# Patient Record
Sex: Male | Born: 1950 | Race: White | Hispanic: No | Marital: Married | Smoking: Never smoker
Health system: Southern US, Community
[De-identification: ages and names within clinical notes are randomized; demographics above are authoritative.]

## PROBLEM LIST (undated history)

## (undated) DIAGNOSIS — Z87442 Personal history of urinary calculi: Secondary | ICD-10-CM

## (undated) DIAGNOSIS — E119 Type 2 diabetes mellitus without complications: Secondary | ICD-10-CM

## (undated) DIAGNOSIS — I10 Essential (primary) hypertension: Secondary | ICD-10-CM

## (undated) DIAGNOSIS — Z9889 Other specified postprocedural states: Secondary | ICD-10-CM

## (undated) HISTORY — DX: Type 2 diabetes mellitus without complications: E11.9

## (undated) HISTORY — PX: HIP FRACTURE SURGERY: SHX118

## (undated) HISTORY — PX: COLONOSCOPY: SHX174

## (undated) HISTORY — PX: HERNIA REPAIR: SHX51

## (undated) HISTORY — PX: PROSTATE SURGERY: SHX751

## (undated) HISTORY — PX: CATARACT EXTRACTION: SUR2

## (undated) HISTORY — DX: Essential (primary) hypertension: I10

## (undated) HISTORY — PX: CYST EXCISION: SHX5701

---

## 1999-06-23 ENCOUNTER — Encounter: Payer: Self-pay | Admitting: Urology

## 1999-06-23 ENCOUNTER — Encounter: Admission: RE | Admit: 1999-06-23 | Discharge: 1999-06-23 | Payer: Self-pay | Admitting: Urology

## 1999-06-23 ENCOUNTER — Ambulatory Visit (HOSPITAL_BASED_OUTPATIENT_CLINIC_OR_DEPARTMENT_OTHER): Admission: RE | Admit: 1999-06-23 | Discharge: 1999-06-23 | Payer: Self-pay | Admitting: Urology

## 2000-01-18 ENCOUNTER — Encounter: Payer: Self-pay | Admitting: Urology

## 2000-01-18 ENCOUNTER — Encounter: Admission: RE | Admit: 2000-01-18 | Discharge: 2000-01-18 | Payer: Self-pay | Admitting: Urology

## 2000-02-04 ENCOUNTER — Encounter: Payer: Self-pay | Admitting: Urology

## 2000-02-04 ENCOUNTER — Ambulatory Visit (HOSPITAL_COMMUNITY): Admission: RE | Admit: 2000-02-04 | Discharge: 2000-02-04 | Payer: Self-pay | Admitting: Urology

## 2000-03-28 ENCOUNTER — Ambulatory Visit (HOSPITAL_COMMUNITY): Admission: RE | Admit: 2000-03-28 | Discharge: 2000-03-28 | Payer: Self-pay | Admitting: Urology

## 2000-03-28 ENCOUNTER — Encounter: Payer: Self-pay | Admitting: Urology

## 2002-08-28 ENCOUNTER — Ambulatory Visit (HOSPITAL_COMMUNITY): Admission: RE | Admit: 2002-08-28 | Discharge: 2002-08-28 | Payer: Self-pay | Admitting: Pediatrics

## 2002-08-28 ENCOUNTER — Encounter: Payer: Self-pay | Admitting: Pediatrics

## 2002-09-11 ENCOUNTER — Ambulatory Visit (HOSPITAL_COMMUNITY): Admission: RE | Admit: 2002-09-11 | Discharge: 2002-09-11 | Payer: Self-pay | Admitting: *Deleted

## 2003-06-14 ENCOUNTER — Emergency Department (HOSPITAL_COMMUNITY): Admission: EM | Admit: 2003-06-14 | Discharge: 2003-06-14 | Payer: Self-pay | Admitting: Emergency Medicine

## 2003-10-24 ENCOUNTER — Ambulatory Visit (HOSPITAL_COMMUNITY): Admission: RE | Admit: 2003-10-24 | Discharge: 2003-10-24 | Payer: Self-pay | Admitting: Pediatrics

## 2003-12-13 ENCOUNTER — Ambulatory Visit (HOSPITAL_COMMUNITY): Admission: RE | Admit: 2003-12-13 | Discharge: 2003-12-13 | Payer: Self-pay | Admitting: Internal Medicine

## 2005-07-24 ENCOUNTER — Emergency Department (HOSPITAL_COMMUNITY): Admission: EM | Admit: 2005-07-24 | Discharge: 2005-07-24 | Payer: Self-pay | Admitting: Emergency Medicine

## 2007-08-02 ENCOUNTER — Emergency Department (HOSPITAL_COMMUNITY): Admission: EM | Admit: 2007-08-02 | Discharge: 2007-08-03 | Payer: Self-pay | Admitting: Emergency Medicine

## 2007-08-04 ENCOUNTER — Ambulatory Visit (HOSPITAL_COMMUNITY): Admission: RE | Admit: 2007-08-04 | Discharge: 2007-08-04 | Payer: Self-pay | Admitting: Urology

## 2008-02-26 ENCOUNTER — Ambulatory Visit (HOSPITAL_COMMUNITY): Admission: RE | Admit: 2008-02-26 | Discharge: 2008-02-26 | Payer: Self-pay | Admitting: Urology

## 2009-09-28 IMAGING — CR DG ABDOMEN 1V
1 series · 1 of 1 positions shown · non-contrast
Comparison: CT scan, 08/03/07.

CLINICAL DATA: 56 year old; left ureteral calculus. Preoperative examination.
ABDOMEN ? 1 VIEW:

[t abdomen supine]
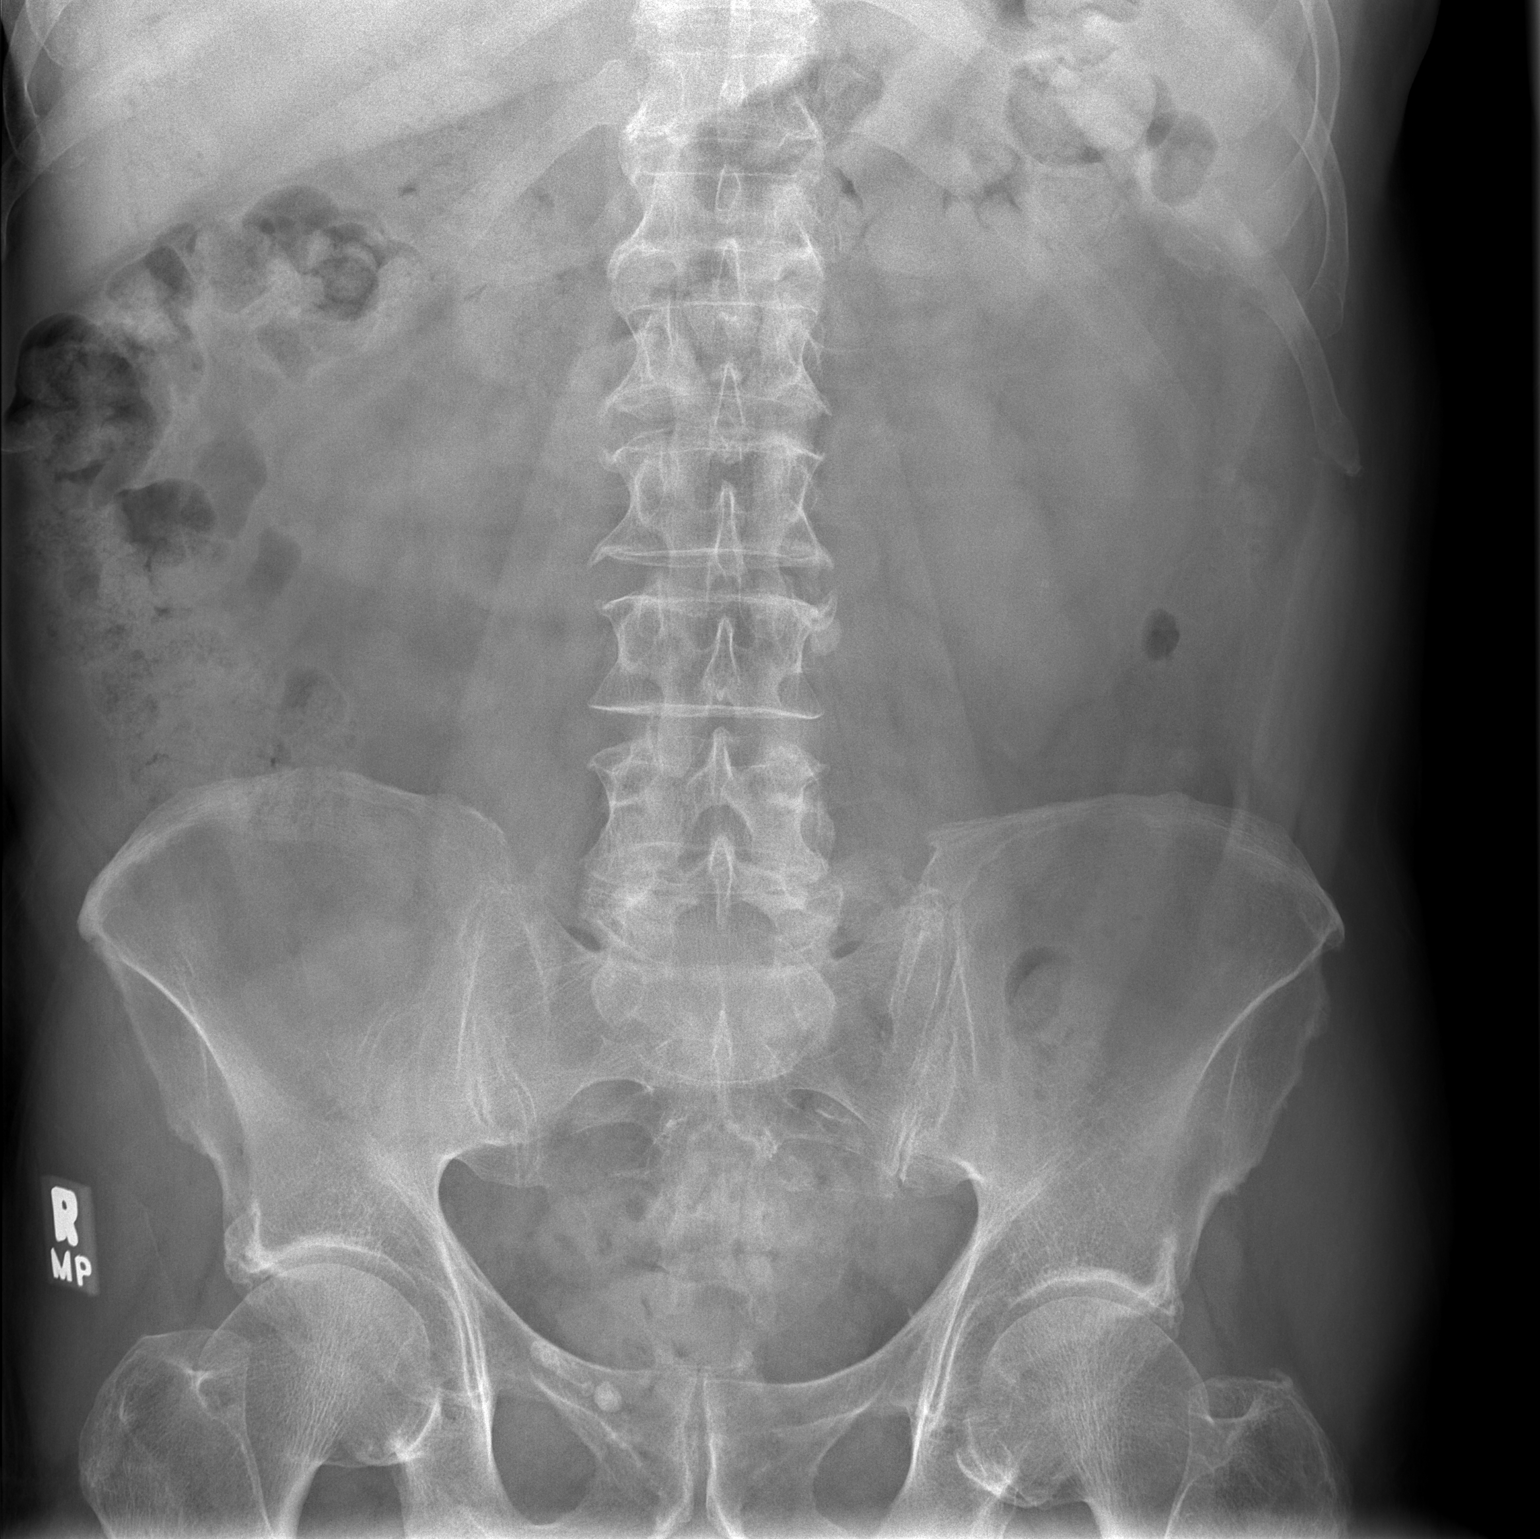

[1 of 1 positions shown; findings below may reference images not displayed]

FINDINGS: The upper left ureteral calculi adjacent to the L3 vertebral body are unchanged.  The larger calculus measures 10.5 mm.
IMPRESSION: No change in position of the left upper ureteral calculus.

## 2010-04-22 IMAGING — CR DG ABDOMEN 1V
1 series · 1 of 1 positions shown · non-contrast
Comparison: 08/04/2007

CLINICAL DATA: Pre ESWL/left-sided stones

ABDOMEN - 1 VIEW

[t abdomen supine]
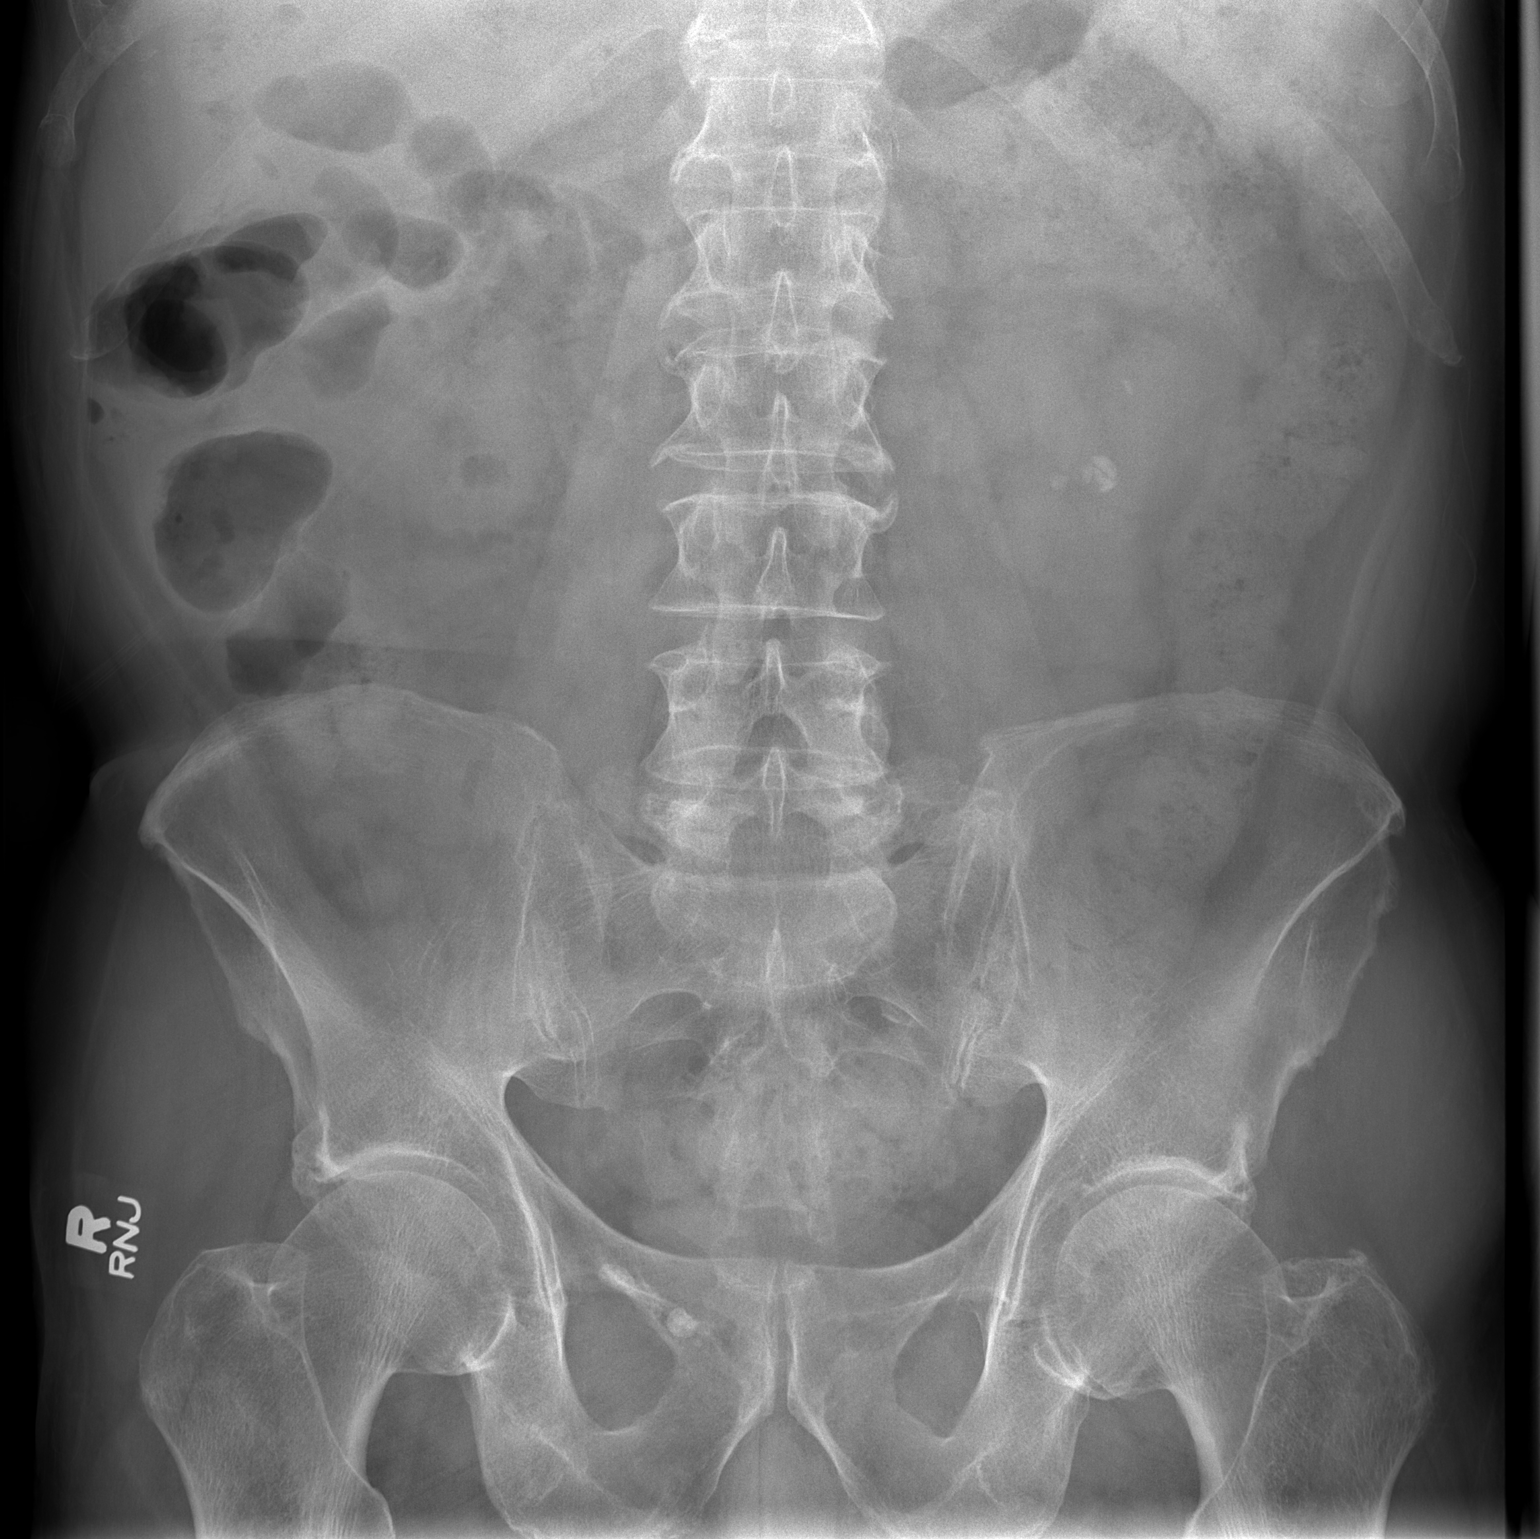

[1 of 1 positions shown; findings below may reference images not displayed]

FINDINGS: Multiple calculi project over the mid and lower left
kidney.  No definite ureteral calculi.] No definite right-sided
stones.  No other significant findings.
IMPRESSION: Multiple left renal calculi.

## 2010-12-01 NOTE — Op Note (Signed)
NAMEHENDRIX, [name]               ACCOUNT NO.:  [id number]   MEDICAL RECORD NO.:  [id number]          PATIENT TYPE:  AMB   LOCATION:  DAY                          FACILITY:  [facility]   PHYSICIAN:  Mark C. Vernie Ammons, M.D.  DATE OF BIRTH:  08/12/1950   DATE OF PROCEDURE:  08/04/2007  DATE OF DISCHARGE:                               OPERATIVE REPORT   PREOPERATIVE DIAGNOSIS:  Left ureteral stone.   POSTOPERATIVE DIAGNOSIS:  Left ureteral stone.   PROCEDURE:  Cystoscopy, left retrograde pyelogram with interpretation,  left ureteroscopy with laser lithotripsy and stone extraction and double  J stent placement.   SURGEON:  Mark C. Vernie Ammons, M.D.   ANESTHESIA:  General.   BLOOD LOSS:  Minimal.   DRAINS:  Six French 24 cm double J stent in the left ureter (no string).   SPECIMENS:  None.   COMPLICATIONS:  None.   INDICATIONS:  The patient is a 60 year old white male who presented to  the office with acute left flank pain.  He had a CT scan done previously  at [facility] that revealed an 8 mm wide stone at the UPJ region.  A KUB  down in my office revealed the stone was very faint and therefore he was  not a candidate for lithotripsy.  We discussed ureteroscopy.  He  understands the risks, complications, alternatives and limitations and  has elected to proceed.   DESCRIPTION OF OPERATION:  After informed consent, the patient was  brought to the major OR and placed on the table and administered general  anesthesia then moved to the dorsal lithotomy position.  His genitalia  was sterilely prepped with Betadine and draped.  An official time-out  was then performed.   A 6 French rigid ureteroscope was then passed into the urethra which was  noted to be normal.  The prostatic urethra revealed no lesions and was  nonobstructing and the bladder was then entered.  Limited visual  inspection revealed no tumors or stones in the floor of the bladder.  The left orifice was identified and the  scope was easily passed into the  left ureter and gently passed up the ureter.  I then passed the  guidewire through the ureteroscope and used this to guide the scope up  the ureter further.  Approximately mid ureter the guidewire was removed  and a retrograde pyelogram was performed.   Left retrograde pyelogram was then performed using full strength  contrast injected through the ureteroscope under direct fluoroscopy.  It  revealed a normal ureter up to a negative filling defect in the upper  ureter consistent with the stone.  There were no abnormalities proximal  to this.   I advanced the short scope as far as I could but I was unable to get all  the way up to the stone so I therefore removed that and left the  guidewire in place.  I then passed the 6 [place] long rigid ureteroscope  over the guidewire up the left ureter but it became somewhat difficult  to see due to the angulation of the ureter and I  therefore did not feel  that this would be an adequate way to visualize the stone.  I therefore  removed that and again left the guidewire in place.   A 6 French flexible ureteroscope was then passed over the guidewire and  I was able to visualize the stone.  In doing so the stone fell back into  the kidney and I therefore passed the flexible scope into the kidney and  was able to identify the stone.  I grasped it with a Nitinol basket and  brought it back into the proximal ureter and left the Nitinol basket on  the stone in order to affix it in position and back the cystoscope off  the Nitinol basket leaving the stone within the Nitinol basket.   I passed the cystoscope in the bladder, passed the guidewire through the  cystoscope and left the guidewire in place and passed the flexible  ureteroscope over the guidewire.  I was able to pass this up to the  level of the stone and visualize the stone.  I used initially the 275  micron Holmium laser fiber and was able to begin to fragment  the stone.  I grasped a lead fragment that was obscuring my view and extracted that  with a Nitinol basket.  I then reinserted the cystoscope, reinserted the  guidewire and this time passed a ureteral access sheath over the  guidewire into the mid ureteral region.  With the access sheath in place  I was able to pass the flexible ureteroscope up and fully fragment the  stone.  I pulled out multiple small pieces in order to visualize larger  pieces more proximally and fragmented these using the 365 micron laser  fiber.  After the stone was fully fragmented I passed a guidewire under  direct visualization after passing the ureteroscope back up into the  renal pelvis and left the guidewire in place, backing the ureteroscope  over the guidewire and leaving the guidewire present.  The cystoscope  was back loaded over the guidewire and the double J stent passed into  the area of the renal pelvis and the guidewire was removed.  Good curl  was noted in the renal pelvis and in the ureter.  Of note, as I was  passing the ureteroscope a final time and removed it I visualized the  ureter and noted no perforation or injury to the ureter.  The bladder  was then drained, the cystoscope removed and 2% lidocaine jelly was  placed in the urethra with penile clamp and the patient was awakened and  taken to recovery room in stable satisfactory condition.  He tolerated  the procedure well.  There were no intraoperative complications.  He  will be given a prescription of Pyridium Plus #36.  He has a  prescription for Dilaudid for pain and was given written instructions  and will return to my office in 5-7 days for stent removal.      Mark C. Vernie Ammons, M.D.  Electronically Signed     MCO/MEDQ  D:  08/04/2007  T:  08/04/2007  Job:  [id number]

## 2010-12-04 NOTE — Op Note (Signed)
NAME:  [name], [name]                         ACCOUNT NO.:  [id number]   MEDICAL RECORD NO.:  [id number]                   PATIENT TYPE:  AMB   LOCATION:  DAY                                  FACILITY:  [facility]   PHYSICIAN:  Lionel December, M.D.                 DATE OF BIRTH:  [birth year]   DATE OF PROCEDURE:  DATE OF DISCHARGE:  12/13/2003                                 OPERATIVE REPORT   PROCEDURE:  Total colonoscopy.   ENDOSCOPIST:  Lionel December, M.D.   INDICATIONS:  This patient is a 60 year old Caucasian male who is undergoing  screening colonoscopy.  Family history is negative for CRC.  Procedure and  risks were reviewed with the patient and informed consent was obtained.   PREOPERATIVE MEDICATIONS:  Demerol 50 mg IV and Versed 8 mg IV in divided  dose.   FINDINGS:  Procedure performed in endoscopy suite.  The patient's vital  signs and O2 saturation were monitored during the procedure and remained  stable.  The patient was placed in the left lateral recumbent position and  rectal examination was performed.  No abnormality noted on external or  digital exam.   Olympus videoscope was placed in the rectum and advanced under vision into  the sigmoid colon.  He had scattered diverticula at sigmoid colon.  One area  revealed mucopurulent exudate and erythema.  These findings were felt to be  typical of diverticulitis.  The scope was advanced into descending colon  without any difficulty and hepatic flexure which was very tortuous with  multiple bends.  Using different positions and abdominal pressure, finally I  was able to advance the scope into cecum which was identified by appendiceal  orifice and the ileocecal valve.  Pictures were taken for the record.  As  the scope was withdrawn colonic mucosa was, once again, carefully examined  and there were no polyps and/or tumor masses.  The rectal mucosa was normal.   The scope was retroflexed to examine anorectal junction and  small  hemorrhoids were noted below the dentate line.  The endoscope was  straightened and withdrawn.  The patient tolerated the procedure well.   FINAL DIAGNOSES:  1. Sigmoid colon diverticulosis along with sigmoid diverticulitis.  2. Small external hemorrhoids.  3. Hepatic flexure was very tortuous and redundant.   RECOMMENDATIONS:  1. High fiber diet.  2. Cipro 500 mg p.o. b.i.d. for 10 days and Metronidazole 500 mg p.o. b.i.d.     for 10 days.      ___________________________________________                                            Lionel December, M.D.   NR/MEDQ  D:  12/13/2003  T:  12/14/2003  Job:  [id number]   cc:  Francoise Schaumann. Halm, D.O.  [address]  [place]  [place] [address]  Fax: [phone]

## 2010-12-04 NOTE — Procedures (Signed)
NAME:  [name], [name]                         ACCOUNT NO.:  [id number]   MEDICAL RECORD NO.:  [id number]                   PATIENT TYPE:  OUT   LOCATION:  RAD                                  FACILITY:  [facility]   PHYSICIAN:  Vida Roller, M.D.                DATE OF BIRTH:  22-Jun-1951   DATE OF PROCEDURE:  09/11/2002  DATE OF DISCHARGE:                                  ECHOCARDIOGRAM   PROCEDURE:  Stress echocardiogram.   TAPE NUMBER:  [id number]   TAPE COUNT:  [id number]   CLINICAL INFORMATION:  This is a 60 year old gentleman with atypical chest  discomfort.   DETAILS OF PROCEDURE:  The patient was imaged in the left lateral decubitus  position with an transthoracic echocardiogram study.  Images were obtained  in the parasternal long and parasternal short apical floor and apical 2-  chamber views.  These were kept recorded on tape, and also on continuous  loop.  The patient was then exercised in the Bruce protocol 12 minutes  attaining 13 METS of exercise.  He had a resting heart rate of 92 which  increased to 161 which is 95% of his maximum predicted heart rate for his  age.  His systolic blood pressure went from 150 to 238 which is generating a  double product of 38.3 thousand.   His resting electrocardiogram was normal. His exercise electrocardiogram  shows minimal ST segment depression with upsloping STs inconsistent with  coronary artery disease.  During the procedure he experienced mild shortness  of breath, but otherwise asymptomatic.  Termination of the study was due to  attaining the target heart rate and hypertension.   Following the stress test the patient was, again, placed in the left lateral  decubitus position.  While the heart rate was still within the maximal range  ultrasonic images of the heart were obtained once again in the parasternal  long, parasternal short apical floor and apical 2-chamber views.  These  views were also captured on a continuous loop format  as well as tape and  were used for comparison for the rest of the views.   RESULTS:  Stress Test is as previously described, and is interpreted as  maximum negative for ischemia.  Exercise and resting images reveal normal  left ventricular systolic function with no significant wall motion  abnormalities and appropriate left ventricular systolic function  augmentation during exercise.   OVERALL INTERPRETATION:  1. Maxed negative EST with normal electric echocardiographic images.  2. No evidence of hemodynamically significant coronary artery disease.                                               Vida Roller, M.D.   JH/MEDQ  D:  09/11/2002  T:  09/11/2002  Job:  [id number]  

## 2010-12-04 NOTE — Op Note (Signed)
[facility]  Patient:    [name]                       MRN: [id number] Proc. Date: 06/23/99 Adm. Date:  [id number] Attending:  Nelma Rothman Iii                           Operative Report  PREOPERATIVE DIAGNOSIS:  Recurrent balanitis.  POSTOPERATIVE DIAGNOSIS:  Recurrent balanitis.  OPERATION:  Circumcision.  SURGEON:  Lucrezia Starch. Ovidio Hanger, M.D.  ANESTHESIA:  General laryngeal airway.  ESTIMATED BLOOD LOSS:  25 cc.  TUBES:  None.  COMPLICATIONS:  None.  INDICATION:  [name] is a very nice 60 year old white male who has been borderline diabetic.  He has been presenting with intermittent balanitis. Miochol cream helped but he has significant cracking and bleeding, especially when he has intercourse.  This has continued to be a problem.  After understanding risks, benefits, and alternatives, he has elected to proceed with circumcision.  DESCRIPTION OF PROCEDURE:  The patient has been placed in the supine position.  After proper general laryngeal airway anesthesia, he was prepped and draped with Betadine in a sterile fashion.  Circumferential incision was made on the shaft kin at the appropriate level and extended down to buxom fascia and corpus spongiosum respectively.  Similar circumferential incision was made approximately 2 mm proximal to the corona aerata and extended down to similar level.  A dorsal incision was made in the foreskin and the foreskin was carefully dissected from the underlying fascia with Bovie coagulation cautery and submitted to pathology.  Thorough irrigation was performed.  Good hemostasis was noted to be present. Shaft skin was then approximated to the mucosa with running 3-0 chromic catgut.  A U-type stitch was placed in the frenula area and a dorsal stitch was placed separately.  Intermittent locking stitches were placed.  The frenulum area was then excised nd a frenuloplasty was  performed with interrupted 4-0 chromic catgut.  The wound was dressed with Vaseline gauze, 4 by 4, and Coban.  The patient was taken to the recovery room stable. DD:  06/23/99 TD:  06/24/99 Job: [id number]

## 2010-12-04 NOTE — Procedures (Signed)
   NAME:  [name], [name]                         ACCOUNT NO.:  [id number]   MEDICAL RECORD NO.:  [id number]                   PATIENT TYPE:  OUT   LOCATION:  RAD                                  FACILITY:  [facility]   PHYSICIAN:  Vida Roller, M.D.                DATE OF BIRTH:  11/27/1950   DATE OF PROCEDURE:  09/11/2002  DATE OF DISCHARGE:                                  ECHOCARDIOGRAM   PROCEDURE:  Exercise echocardiogram.   INDICATION:  The patient is a 60 year old male with no known coronary artery  disease who presented with complaints of chest discomfort.  His cardiac risk  factors include tobacco, diabetes, hyperlipidemia, and male sex.   BASELINE DATA:  EKG:  Sinus rhythm at 72 beats per minute.  Blood pressure  150/88.   The patient exercised for a total of 12 minutes to 12.9 METS.  Maximum heart  rate achieved was 161 beats per minute, maximum blood pressure was 238/90.  The patient did experience some shortness of breath, which resolved at rest.  EKG showed no changes consistent with ischemia and no arrhythmias.  There  was a normal blood pressure response.  Test was stopped secondary to  elevated blood pressure and target heart rate was exceeded.  Final results  are pending M.D. review.     Amy Mercy Riding, P.A. [facility]                     Vida Roller, M.D.    AB/MEDQ  D:  09/11/2002  T:  09/11/2002  Job:  [id number]

## 2011-04-08 LAB — BASIC METABOLIC PANEL
BUN: 17
CO2: 24
Calcium: 9
Chloride: 101
Creatinine, Ser: 1.3
GFR calc Af Amer: >60
GFR calc non Af Amer: 57 — ABNORMAL LOW
Glucose, Bld: 167 — ABNORMAL HIGH
Potassium: 4.2
Sodium: 134 — ABNORMAL LOW

## 2011-04-08 LAB — URINALYSIS, ROUTINE W REFLEX MICROSCOPIC
Bilirubin Urine: NEGATIVE
Glucose, UA: NEGATIVE
Glucose, UA: NEGATIVE
Hgb urine dipstick: NEGATIVE
Ketones, ur: >80 — AB
Ketones, ur: 40 — AB
Nitrite: NEGATIVE
Protein, ur: NEGATIVE
Protein, ur: NEGATIVE
Specific Gravity, Urine: 1.022
Urobilinogen, UA: 0.2
pH: 5.5
pH: 6

## 2011-04-08 LAB — HEMOGLOBIN AND HEMATOCRIT, BLOOD
HCT: 44.6
Hemoglobin: 15.7

## 2011-04-08 LAB — URINE MICROSCOPIC-ADD ON

## 2011-04-16 LAB — GLUCOSE, CAPILLARY: Glucose-Capillary: 145 — ABNORMAL HIGH

## 2012-12-20 ENCOUNTER — Telehealth: Payer: Self-pay | Admitting: *Deleted

## 2012-12-20 NOTE — Telephone Encounter (Signed)
Faxed supply rx back.

## 2016-12-15 DIAGNOSIS — I10 Essential (primary) hypertension: Secondary | ICD-10-CM | POA: Diagnosis not present

## 2016-12-15 DIAGNOSIS — N400 Enlarged prostate without lower urinary tract symptoms: Secondary | ICD-10-CM | POA: Diagnosis not present

## 2016-12-15 DIAGNOSIS — E119 Type 2 diabetes mellitus without complications: Secondary | ICD-10-CM | POA: Diagnosis not present

## 2016-12-15 DIAGNOSIS — Z1159 Encounter for screening for other viral diseases: Secondary | ICD-10-CM | POA: Diagnosis not present

## 2016-12-21 DIAGNOSIS — I10 Essential (primary) hypertension: Secondary | ICD-10-CM | POA: Diagnosis not present

## 2016-12-21 DIAGNOSIS — E119 Type 2 diabetes mellitus without complications: Secondary | ICD-10-CM | POA: Diagnosis not present

## 2016-12-21 DIAGNOSIS — R972 Elevated prostate specific antigen [PSA]: Secondary | ICD-10-CM | POA: Diagnosis not present

## 2016-12-21 DIAGNOSIS — Z6826 Body mass index (BMI) 26.0-26.9, adult: Secondary | ICD-10-CM | POA: Diagnosis not present

## 2016-12-21 DIAGNOSIS — N400 Enlarged prostate without lower urinary tract symptoms: Secondary | ICD-10-CM | POA: Diagnosis not present

## 2016-12-21 DIAGNOSIS — M25569 Pain in unspecified knee: Secondary | ICD-10-CM | POA: Diagnosis not present

## 2016-12-21 DIAGNOSIS — Z87442 Personal history of urinary calculi: Secondary | ICD-10-CM | POA: Diagnosis not present

## 2016-12-21 DIAGNOSIS — E782 Mixed hyperlipidemia: Secondary | ICD-10-CM | POA: Diagnosis not present

## 2017-02-21 DIAGNOSIS — E119 Type 2 diabetes mellitus without complications: Secondary | ICD-10-CM | POA: Diagnosis not present

## 2017-04-20 DIAGNOSIS — I10 Essential (primary) hypertension: Secondary | ICD-10-CM | POA: Diagnosis not present

## 2017-04-20 DIAGNOSIS — E119 Type 2 diabetes mellitus without complications: Secondary | ICD-10-CM | POA: Diagnosis not present

## 2017-04-20 DIAGNOSIS — R972 Elevated prostate specific antigen [PSA]: Secondary | ICD-10-CM | POA: Diagnosis not present

## 2017-04-20 DIAGNOSIS — Z1159 Encounter for screening for other viral diseases: Secondary | ICD-10-CM | POA: Diagnosis not present

## 2017-04-22 DIAGNOSIS — N200 Calculus of kidney: Secondary | ICD-10-CM | POA: Diagnosis not present

## 2017-04-22 DIAGNOSIS — R944 Abnormal results of kidney function studies: Secondary | ICD-10-CM | POA: Diagnosis not present

## 2017-04-22 DIAGNOSIS — I10 Essential (primary) hypertension: Secondary | ICD-10-CM | POA: Diagnosis not present

## 2017-04-22 DIAGNOSIS — R972 Elevated prostate specific antigen [PSA]: Secondary | ICD-10-CM | POA: Diagnosis not present

## 2017-04-22 DIAGNOSIS — E119 Type 2 diabetes mellitus without complications: Secondary | ICD-10-CM | POA: Diagnosis not present

## 2017-04-22 DIAGNOSIS — Z6826 Body mass index (BMI) 26.0-26.9, adult: Secondary | ICD-10-CM | POA: Diagnosis not present

## 2017-04-22 DIAGNOSIS — E782 Mixed hyperlipidemia: Secondary | ICD-10-CM | POA: Diagnosis not present

## 2017-04-22 DIAGNOSIS — Z23 Encounter for immunization: Secondary | ICD-10-CM | POA: Diagnosis not present

## 2017-05-20 DIAGNOSIS — N200 Calculus of kidney: Secondary | ICD-10-CM | POA: Diagnosis not present

## 2017-05-20 DIAGNOSIS — N401 Enlarged prostate with lower urinary tract symptoms: Secondary | ICD-10-CM | POA: Diagnosis not present

## 2017-05-20 DIAGNOSIS — I10 Essential (primary) hypertension: Secondary | ICD-10-CM | POA: Diagnosis not present

## 2017-05-20 DIAGNOSIS — R944 Abnormal results of kidney function studies: Secondary | ICD-10-CM | POA: Diagnosis not present

## 2017-06-17 DIAGNOSIS — M25552 Pain in left hip: Secondary | ICD-10-CM | POA: Diagnosis not present

## 2017-06-18 DIAGNOSIS — S72041A Displaced fracture of base of neck of right femur, initial encounter for closed fracture: Secondary | ICD-10-CM | POA: Diagnosis not present

## 2017-06-18 DIAGNOSIS — Z9119 Patient's noncompliance with other medical treatment and regimen: Secondary | ICD-10-CM | POA: Diagnosis not present

## 2017-06-18 DIAGNOSIS — S72002A Fracture of unspecified part of neck of left femur, initial encounter for closed fracture: Secondary | ICD-10-CM | POA: Diagnosis not present

## 2017-06-18 DIAGNOSIS — S299XXA Unspecified injury of thorax, initial encounter: Secondary | ICD-10-CM | POA: Diagnosis not present

## 2017-06-18 DIAGNOSIS — K922 Gastrointestinal hemorrhage, unspecified: Secondary | ICD-10-CM | POA: Diagnosis not present

## 2017-06-18 DIAGNOSIS — Z720 Tobacco use: Secondary | ICD-10-CM | POA: Diagnosis not present

## 2017-06-18 DIAGNOSIS — F17210 Nicotine dependence, cigarettes, uncomplicated: Secondary | ICD-10-CM | POA: Diagnosis not present

## 2017-06-18 DIAGNOSIS — R079 Chest pain, unspecified: Secondary | ICD-10-CM | POA: Diagnosis not present

## 2017-06-18 DIAGNOSIS — E1165 Type 2 diabetes mellitus with hyperglycemia: Secondary | ICD-10-CM | POA: Diagnosis not present

## 2017-06-18 DIAGNOSIS — Z5189 Encounter for other specified aftercare: Secondary | ICD-10-CM | POA: Diagnosis not present

## 2017-06-18 DIAGNOSIS — I10 Essential (primary) hypertension: Secondary | ICD-10-CM | POA: Diagnosis not present

## 2017-06-18 DIAGNOSIS — Z7984 Long term (current) use of oral hypoglycemic drugs: Secondary | ICD-10-CM | POA: Diagnosis not present

## 2017-06-18 DIAGNOSIS — S72102B Unspecified trochanteric fracture of left femur, initial encounter for open fracture type I or II: Secondary | ICD-10-CM | POA: Diagnosis not present

## 2017-06-18 DIAGNOSIS — E119 Type 2 diabetes mellitus without complications: Secondary | ICD-10-CM | POA: Diagnosis not present

## 2017-06-18 DIAGNOSIS — Z7982 Long term (current) use of aspirin: Secondary | ICD-10-CM | POA: Diagnosis not present

## 2017-06-18 DIAGNOSIS — S72142A Displaced intertrochanteric fracture of left femur, initial encounter for closed fracture: Secondary | ICD-10-CM | POA: Diagnosis not present

## 2017-06-18 DIAGNOSIS — Z881 Allergy status to other antibiotic agents status: Secondary | ICD-10-CM | POA: Diagnosis not present

## 2017-06-18 DIAGNOSIS — N400 Enlarged prostate without lower urinary tract symptoms: Secondary | ICD-10-CM | POA: Diagnosis not present

## 2017-06-23 DIAGNOSIS — Z712 Person consulting for explanation of examination or test findings: Secondary | ICD-10-CM | POA: Diagnosis not present

## 2017-06-23 DIAGNOSIS — S72143A Displaced intertrochanteric fracture of unspecified femur, initial encounter for closed fracture: Secondary | ICD-10-CM | POA: Diagnosis not present

## 2017-07-01 ENCOUNTER — Telehealth: Payer: Self-pay | Admitting: Orthopedic Surgery

## 2017-07-01 NOTE — Telephone Encounter (Signed)
Referral received from Dr Dwana MelenaZack Hall requesting follow/up for problem: left femur [place], [place]  Notes have been received and placed in Dr's box for review.  Please advise.

## 2017-07-05 ENCOUNTER — Ambulatory Visit: Payer: Self-pay | Admitting: Orthopedic Surgery

## 2017-07-05 ENCOUNTER — Ambulatory Visit (INDEPENDENT_AMBULATORY_CARE_PROVIDER_SITE_OTHER): Payer: Medicare PPO

## 2017-07-05 ENCOUNTER — Telehealth (HOSPITAL_COMMUNITY): Payer: Self-pay | Admitting: Internal Medicine

## 2017-07-05 ENCOUNTER — Encounter: Payer: Self-pay | Admitting: Orthopedic Surgery

## 2017-07-05 VITALS — BP 146/73 | HR 82 | Ht 70.5 in | Wt 176.0 lb

## 2017-07-05 DIAGNOSIS — Z4889 Encounter for other specified surgical aftercare: Secondary | ICD-10-CM

## 2017-07-05 DIAGNOSIS — S72145A Nondisplaced intertrochanteric fracture of left femur, initial encounter for closed fracture: Secondary | ICD-10-CM

## 2017-07-05 NOTE — Telephone Encounter (Signed)
07/05/17  I spoke to patient and offered him a 9am for 12/19 and he said he had to check with his wife who holds the schedule and he will call us back.

## 2017-07-05 NOTE — Patient Instructions (Signed)
[facility] [phone] is the phone number to call if you want to call to schedule. Please let us know if you do not hear anything within one week. Ask for Amy

## 2017-07-05 NOTE — Progress Notes (Signed)
POST OP APPT   ASSUME CARE   Chief Complaint  Patient presents with  . Routine Post Op    surgery 06/19/17 injury while at [place] staples removed today//ORIF LEFT HIP     ORIF LEFT HIP [facility] 2 HOLE PLATE  LOVENOX 2 WEEKS COMPLETED  WB STATUS AS TOLERATED  STAPLES OUT 2 WEEKS - TODAY Pain med percocet q4 then hydrocodone, which he stopped  Operating Doctor :  [facility]   The staples were removed from the wound today.  The patient is weightbearing as tolerated with a walker.  His x-ray looks good please see my dictated report  The incision looks good he has some mild edema in his leg no evidence of DVT calf is soft and supple Homans sign is negative  I have reviewed and [facility] pages of documents related to the patient's care  Plan is to continue weight-bear as tolerated x-ray in 4 weeks

## 2017-07-06 ENCOUNTER — Other Ambulatory Visit: Payer: Self-pay

## 2017-07-06 ENCOUNTER — Encounter (HOSPITAL_COMMUNITY): Payer: Self-pay

## 2017-07-06 ENCOUNTER — Ambulatory Visit (HOSPITAL_COMMUNITY): Payer: Medicare PPO | Attending: Orthopedic Surgery

## 2017-07-06 DIAGNOSIS — R262 Difficulty in walking, not elsewhere classified: Secondary | ICD-10-CM

## 2017-07-06 DIAGNOSIS — M25652 Stiffness of left hip, not elsewhere classified: Secondary | ICD-10-CM | POA: Diagnosis not present

## 2017-07-06 DIAGNOSIS — M6281 Muscle weakness (generalized): Secondary | ICD-10-CM | POA: Diagnosis not present

## 2017-07-06 DIAGNOSIS — M25552 Pain in left hip: Secondary | ICD-10-CM | POA: Diagnosis not present

## 2017-07-06 NOTE — Therapy (Signed)
[facility] [address] [place], [place], [address] Phone: [phone]   Fax:  [phone]  Physical Therapy Evaluation  Patient Details  Name: [name] MRN: [id number] Date of Birth: 06-Aug-1950 Referring Provider: Fuller Canada, MD   Encounter Date: 07/06/2017  PT End of Session - 07/06/17 1612    Visit Number  1    Number of Visits  17    Date for PT Re-Evaluation  08/03/17 mini reassess    Authorization Type  [employer] CHO    Authorization Time Period  07/06/17 to 08/31/17    Authorization - Visit Number  1    Authorization - Number of Visits  10    PT Start Time  1515    PT Stop Time  1605    PT Time Calculation (min)  50 min    Activity Tolerance  Patient tolerated treatment well    Behavior During Therapy  [facility] for tasks assessed/performed       History reviewed. No pertinent past medical history.  History reviewed. No pertinent surgical history.  There were no vitals filed for this visit.   Subjective Assessment - 07/06/17 1525    Subjective  Pt states that he was going to step over a rope, he hung his R foot, fell on the L hip and fractured it on 06/17/17 and had surgery on 06/19/17. He said that he had a compression screw placed as surgical treatment. He is WBAT. He states that he is sleeping sitting up and after a long nap or nights sleep his hip feels very tight and twitches. He said that if he does a lot of walking he has hip pain, but he will take Tylenol and that will help it. He said that his ankle is swollen and his incision is a little sore but since the staples have been removed that's improved tremendously. He was fully independent and not using an AD prior to his injury.    Limitations  Sitting;Walking;Standing;House hold activities    How long can you sit comfortably?  45 -60 mins with knee slightly extended    How long can you stand comfortably?  45 mins easy    How long can you walk comfortably?   15-20 mins     Patient Stated Goals  get back to where he was    Currently in Pain?  No/denies         [facility] PT Assessment - 07/06/17 0001      Assessment   Medical Diagnosis  Closed non-displaced intertrochanteric fracture of L femur    Referring Provider  Fuller Canada, MD    Onset Date/Surgical Date  06/17/17 surgery on 06/19/17    Next MD Visit  08/03/17    Prior Therapy  none      Precautions   Precautions  None    Precaution Comments  WBAT      Restrictions   Weight Bearing Restrictions  No      Balance Screen   Has the patient fallen in the past 6 months  Yes    How many times?  1 when he broke his hip    Has the patient had a decrease in activity level because of a fear of falling?   No    Is the patient reluctant to leave their home because of a fear of falling?   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, scout leader      Cognition   Overall Cognitive Status  Within Functional Limits for tasks assessed      Observation/Other Assessments   Observations  Pt's L thigh, knee, and ankle swollen which limited his knee and ankle ROM slightly    Skin Integrity  scar is well-healing, slight redness around edges but normal for 17 days PO and staple removal yesterday; not warm to touch    Focus on Therapeutic Outcomes (FOTO)   63% limitation      ROM / Strength   AROM / PROM / Strength  AROM;Strength;PROM      AROM   AROM Assessment Site  Hip    Right/Left Hip  Left    Left Hip Extension  -11    Left Hip Flexion  96    Left Hip External Rotation   16 in sitting    Left Hip Internal Rotation   3 in sitting    Left Hip ABduction  -- assess next visit in supine    Left Hip ADduction  -- assess next visit in supine      PROM   PROM Assessment Site  Hip    Right/Left Hip  Left    Left Hip Extension  -- assess next visit    Left Hip Flexion  -- assess next visit    Left Hip External Rotation   30  in sitting    Left Hip Internal Rotation   13 in sitting    Left Hip ABduction  -- assess next visit    Left Hip ADduction  -- assess next visit in supine      Strength   Strength Assessment Site  Hip;Knee;Ankle    Right/Left Hip  Right;Left    Right Hip Flexion  5/5    Right Hip Extension  4+/5    Left Hip Flexion  3+/5 pain    Left Hip Extension  2+/5    Left Hip External Rotation  3-/5 through available range    Left Hip Internal Rotation  4-/5 through available range    Left Hip ABduction  2+/5    Right/Left Knee  Right;Left    Right Knee Flexion  5/5    Right Knee Extension  5/5    Left Knee Flexion  4/5    Left Knee Extension  4+/5    Right Ankle Dorsiflexion  5/5    Left Ankle Dorsiflexion  4+/5      Palpation   Palpation comment  increased soft tissue restrictions of L glutes, quads, HS, and calf with most tenderness to palpation of L quad and ITB; incision had tight soft tissue surrounding it      Bed Mobility   Bed Mobility  -- deficient due to pain and weakness      Ambulation/Gait   Ambulation Distance (Feet)  304 Feet    Assistive device  Rolling walker    Gait Pattern  Step-through pattern;Decreased arm swing - right;Decreased arm swing - left;Decreased step length - right;Decreased stance time - left;Decreased stride length;Decreased hip/knee flexion - left;Decreased dorsiflexion - left;Decreased weight shift to left;Antalgic decr hip ext L, slight incr knee flex L, slight trunk flexed      Balance   Balance Assessed  Yes      Static Standing Balance   Static Standing - Balance Support  No upper extremity supported;Right upper extremity supported    Static Standing Balance -  Activities  Single Leg Stance - Right Leg;Single Leg Stance - Left Leg    Static Standing - Comment/# of Minutes  R (no UE): 5 sec; L: (RUE only): 15 sec (limited due to pain)      Standardized Balance Assessment   Standardized Balance Assessment  Five Times Sit to Stand;Timed Up  and Go Test    Five times sit to stand comments   20.6, LLE extended out      Timed Up and Go Test   TUG  Normal TUG    Normal TUG (seconds)  20.04 RW          Objective measurements completed on examination: See above findings.       [facility] Adult PT Treatment/Exercise - 07/06/17 0001      Exercises   Exercises  Knee/Hip      Knee/Hip Exercises: Stretches   Passive Hamstring Stretch  Left;1 rep;30 seconds    Passive Hamstring Stretch Limitations  supine with rope      Knee/Hip Exercises: Supine   Heel Slides  Left;5 reps    Heel Slides Limitations  supine with rope          PT Education - 07/06/17 1611    Education provided  Yes    Education Details  exam findings, POC, HEP    Person(s) Educated  Patient    Methods  Explanation;Demonstration;Handout    Comprehension  Verbalized understanding;Returned demonstration       PT Short Term Goals - 07/06/17 1636      PT SHORT TERM GOAL #1   Title  Pt will be independent with HEP in order to maximize overall recovery and function at home.    Time  4    Period  Weeks    Status  New    Target Date  08/03/17      PT SHORT TERM GOAL #2   Title  Pt will have improved L hip AROM by 10 deg in all planes in order to decrease pain and maximize pt's ability to tolerate sitting and standing for longer periods of time.     Time  4    Period  Weeks    Status  New      PT SHORT TERM GOAL #3   Title  Pt will have 1/2 grade improvement in MMT of all muscle groups tested in order to decrease pain and maximize ROM, gait, and balance    Time  4    Period  Weeks    Status  New        PT Long Term Goals - 07/06/17 1640      PT LONG TERM GOAL #1   Title  Pt will have L hip AROM WFL without pain in order to maximize his ability to squat down and pick up items off the ground/floor.     Time  8    Period  Weeks    Status  New    Target Date  08/31/17      PT LONG TERM GOAL #2   Title  Pt will have 1 grade improvement in MMT  of all muscle groups tested in order to decrease pain and allow pt to perform all ADLs and IADLs with greater ease.     Time  8    Period  Weeks    Status  New      PT LONG TERM GOAL #3   Title  Pt will be able to ambulate at least 881ft  during the 3MWT with LRAD, gait WFL, and no pain, in order to allow pt to return to strolling with his 3YO grandson.    Time  8    Period  Weeks    Status  New      PT LONG TERM GOAL #4   Title  Pt will be able to perform bil SLS for 10 sec with no UE in order to maximize pt's gait on uneven ground and allow pt to return to hiking/outdoor activities.     Time  8    Period  Weeks    Status  New      PT LONG TERM GOAL #5   Title  Pt will be able to perform the TUG in 12 sec or < with LRAD in order to demo improved balance and maximize his community access.    Time  8    Period  Weeks    Status  New      Additional Long Term Goals   Additional Long Term Goals  Yes      PT LONG TERM GOAL #6   Title  Pt will have improved 5xSTS to 12 sec or < to demo improved functional strength and overall balance.    Time  8    Period  Weeks    Status  New             Plan - 07/06/17 1623    Clinical Impression Statement  Pt is pleasant 66 YO M who presents to [facility]PP[facility] s/p L hip fracture on 06/17/17 who underwent ORIF on 06/19/17 [place]. Pt is WBAT and currently ambulates with RW. He presents with deficits in L hip A/PROM, MMT, SLS, gait, functional strength, and difficulty performing IADLs due to pain and weakness. Pt also has increased edema of LLE at the thigh, knee, and ankle. He had his staples removed yesterday and his scar is well-healing. Pt currently functioning below his PLOF and needs skilled PT intervention to address these deficits found in order to maximize his return to PLOF.     History and Personal Factors relevant to plan of care:  very motivated to pariticipate with PT, very active prior to injury, relatively healthy  individual with min co-morbidities    Clinical Presentation  Stable    Clinical Presentation due to:  ROM, MMT, SLS, TUG, 5xSTS, 3MWT, FOTO, clinical judgement    Clinical Decision Making  Low    Rehab Potential  Excellent    PT Frequency  2x / week    PT Duration  8 weeks    PT Treatment/Interventions  ADLs/Self Care Home Management;Cryotherapy;Electrical Stimulation;Moist Heat;DME Instruction;Gait training;Stair training;Functional mobility training;Therapeutic activities;Therapeutic exercise;Balance training;Neuromuscular re-education;Patient/family education;Manual techniques;Scar mobilization;Passive range of motion;Dry needling;Energy conservation;Taping    PT Next Visit Plan  review goals, complete A/PROM measurements; manual for edema and soft tissue restrictions, joint mobs for hip ROM, stretching, trial rockerboard, strengthening as tolerated    PT Home Exercise Plan  eval: heel slides, supine HS stretch, standing lateral weight shifts    Consulted and Agree with Plan of Care  Patient       Patient will benefit from skilled therapeutic intervention in order to improve the following deficits and impairments:  Abnormal gait, Decreased activity tolerance, Decreased balance, Decreased endurance, Decreased mobility, Decreased range of motion, Decreased scar mobility, Decreased strength, Difficulty walking, Hypomobility, Increased edema, Increased fascial restricitons, Increased muscle spasms, Impaired flexibility, Improper body mechanics, Pain  Visit Diagnosis: Stiffness of  left hip, not elsewhere classified - Plan: PT plan of care cert/re-cert  Pain in left hip - Plan: PT plan of care cert/re-cert  Muscle weakness (generalized) - Plan: PT plan of care cert/re-cert  Difficulty in walking, not elsewhere classified - Plan: PT plan of care cert/re-cert  G-Codes - 07/06/17 1655    Functional Assessment Tool Used (Outpatient Only)  clinical judgment, MMT, ROM, SLS, 3MWT, TUG, 5xSTS     Functional Limitation  Mobility: Walking and moving around    Mobility: Walking and Moving Around Current Status ([id number])  At least 40 percent but less than 60 percent impaired, limited or restricted    Mobility: Walking and Moving Around Goal Status ([id number](G8979)  At least 1 percent but less than 20 percent impaired, limited or restricted        Problem List There are no active problems to display for this patient.     Jac CanavanBrooke Veera Stapleton PT, DPT  [facility] [address] [place], [place], [address] Phone: [phone]   Fax:  [phone]  Name: [name] MRN: [id number] Date of Birth: 03/16/1951

## 2017-07-06 NOTE — Patient Instructions (Signed)
  HEEL SLIDES - LONG SIT WITH TOWEL AND BELT  While in a sitting position, place a small hand towel under your heel. Next, loop a belt, towel or bed sheet around your foot and pull your knee into a bend position as your foot slides towards your buttock. Hold a gentle stretch and then return back to original position.  Perform 1-2x/day, 2-3 sets of 10-15 reps with rope   HAMSTRING STRETCH WITH MULTI-LOOP STRAP  Lie on your back and place a stretching strap on your foot. Pull on the strap to assist in raising your leg up for a stretch to the back of your leg.   Keep your target leg straight to slightly bent the entire time.    Perform 1-2x/day, 3-5 stretches holding for 30-60 seconds each   WEIGHT SHIFT - LATERAL  While in a standing position and knees partially bent, slowly shift your body weight side-to-side.   Perform 1-2x/day, 2-3 sets of 10-15 reps

## 2017-07-07 ENCOUNTER — Encounter (HOSPITAL_COMMUNITY): Payer: Self-pay

## 2017-07-07 ENCOUNTER — Ambulatory Visit (HOSPITAL_COMMUNITY): Payer: Medicare PPO

## 2017-07-07 DIAGNOSIS — M25652 Stiffness of left hip, not elsewhere classified: Secondary | ICD-10-CM | POA: Diagnosis not present

## 2017-07-07 DIAGNOSIS — M25552 Pain in left hip: Secondary | ICD-10-CM | POA: Diagnosis not present

## 2017-07-07 DIAGNOSIS — R262 Difficulty in walking, not elsewhere classified: Secondary | ICD-10-CM | POA: Diagnosis not present

## 2017-07-07 DIAGNOSIS — M6281 Muscle weakness (generalized): Secondary | ICD-10-CM | POA: Diagnosis not present

## 2017-07-07 NOTE — Therapy (Signed)
[facility] [address] [place], [place], [address] Phone: [phone]   Fax:  [phone]  Physical Therapy Treatment  Patient Details  Name: Troy GambleJerry W Rivera MRN: [id number] Date of Birth: 01/08/1951 Referring Provider: Fuller CanadaStanley Harrison, MD   Encounter Date: 07/07/2017  PT End of Session - 07/07/17 0954    Visit Number  2    Number of Visits  17    Date for PT Re-Evaluation  08/03/17    Authorization Type  [employer] CHO    Authorization Time Period  07/06/17 to 08/31/17    Authorization - Visit Number  2    Authorization - Number of Visits  10    PT Start Time  0950    PT Stop Time  1032    PT Time Calculation (min)  42 min    Activity Tolerance  Patient tolerated treatment well    Behavior During Therapy  [facility] for tasks assessed/performed       History reviewed. No pertinent past medical history.  History reviewed. No pertinent surgical history.  There were no vitals filed for this visit.  Subjective Assessment - 07/07/17 0944    Subjective  Pt stated he is feeling pretty good today, no reports of pain.  Has began HEP without questions.  No reports of recent falls.      Patient Stated Goals  get back to where he was    Currently in Pain?  No/denies         [facility] PT Assessment - 07/07/17 0001      Assessment   Medical Diagnosis  Closed non-displaced intertrochanteric fracture of L femur    Referring Provider  Fuller CanadaStanley Harrison, MD    Onset Date/Surgical Date  06/17/17 surgery on 06/19/17    Next MD Visit  08/03/17      Precautions   Precautions  None    Precaution Comments  WBAT      ROM / Strength   AROM / PROM / Strength  AROM;Strength      AROM   Left Hip ABduction  35    Left Hip ADduction  18      PROM   Left Hip Extension  20    Left Hip Flexion  105    Left Hip ABduction  38    Left Hip ADduction  23                  [facility] Adult PT Treatment/Exercise - 07/07/17 0001      Ambulation/Gait   Ambulation Distance (Feet)  226 Feet    Assistive device  Rolling walker    Gait Pattern  Step-through pattern;Decreased arm swing - right;Decreased arm swing - left;Decreased step length - right;Decreased stance time - left;Decreased stride length;Decreased hip/knee flexion - left;Decreased dorsiflexion - left;Decreased weight shift to left;Antalgic    Stairs  -- 1RT step to pattern with 1 HR    Gait Comments  Cueing for equal stance phase and stride length      Knee/Hip Exercises: Stretches   Active Hamstring Stretch  2 reps;30 seconds    Quad Stretch  2 reps;30 seconds prone      Knee/Hip Exercises: Standing   Stairs  7in step to pattern with1 HR assistance (discussed holiday    Rocker Board  2 minutes R/L      Knee/Hip Exercises: Supine   Heel Slides  10 reps    Heel Slides Limitations  supine with towel for hip  flexoin as well as slides for abd/add    Other Supine Knee/Hip Exercises  abduction slides 10x AAROM to AROM      Manual Therapy   Manual Therapy  Edema management;Soft tissue mobilization    Manual therapy comments  Manual therapy complete separate than rest of tx    Edema Management  Retro massage with LE elevated    Soft tissue mobilization  quadriceps with LE elevated             PT Education - 07/07/17 1157    Education provided  Yes    Education Details  Reviewed goals, assured complaince with HEP and copy of eval given to pt.  Instructed step to pattern with stairs for safety and confidence prior family holiday event.      Person(s) Educated  Patient    Methods  Explanation;Demonstration;Handout    Comprehension  Verbalized understanding;Returned demonstration       PT Short Term Goals - 07/06/17 1636      PT SHORT TERM GOAL #1   Title  Pt will be independent with HEP in order to maximize overall recovery and function at home.    Time  4    Period  Weeks    Status  New    Target Date  08/03/17      PT SHORT TERM GOAL #2   Title   Pt will have improved L hip AROM by 10 deg in all planes in order to decrease pain and maximize pt's ability to tolerate sitting and standing for longer periods of time.     Time  4    Period  Weeks    Status  New      PT SHORT TERM GOAL #3   Title  Pt will have 1/2 grade improvement in MMT of all muscle groups tested in order to decrease pain and maximize ROM, gait, and balance    Time  4    Period  Weeks    Status  New        PT Long Term Goals - 07/06/17 1640      PT LONG TERM GOAL #1   Title  Pt will have L hip AROM WFL without pain in order to maximize his ability to squat down and pick up items off the ground/floor.     Time  8    Period  Weeks    Status  New    Target Date  08/31/17      PT LONG TERM GOAL #2   Title  Pt will have 1 grade improvement in MMT of all muscle groups tested in order to decrease pain and allow pt to perform all ADLs and IADLs with greater ease.     Time  8    Period  Weeks    Status  New      PT LONG TERM GOAL #3   Title  Pt will be able to ambulate at least 871ft during the with LRAD, gait WFL, and no pain, in order to allow pt to return to strolling with his 3YO grandson.    Time  8    Period  Weeks    Status  New      PT LONG TERM GOAL #4   Title  Pt will be able to perform bil SLS for 10 sec with no UE in order to maximize pt's gait on uneven ground and allow pt to return to hiking/outdoor activities.  Time  8    Period  Weeks    Status  New      PT LONG TERM GOAL #5   Title  Pt will be able to perform the TUG in 12 sec or < with LRAD in order to demo improved balance and maximize his community access.    Time  8    Period  Weeks    Status  New      Additional Long Term Goals   Additional Long Term Goals  Yes      PT LONG TERM GOAL #6   Title  Pt will have improved 5xSTS to 12 sec or < to demo improved functional strength and overall balance.    Time  8    Period  Weeks    Status  New            Plan -  07/07/17 1151    Clinical Impression Statement  Reviewed goals, assured compliance with HEP and copy of eval given to pt.  A/PROM measurements complete per PT POC.  Manual technqiues complete to address edema present Lt LE and soft tissue mobilization to address tightness in quadriceps with report of improved flexibilty following.  Min cueing to improve gait mechanics for equal stance phase with RW.  Pt instructed step to pattern with stair ascending and descending following reports of need to do steps for family holiday, pt able to demonstrate good control with step to pattern and 1 HR A.  No reports of increased pain through session.      Rehab Potential  Excellent    PT Frequency  2x / week    PT Duration  8 weeks    PT Treatment/Interventions  ADLs/Self Care Home Management;Cryotherapy;Electrical Stimulation;Moist Heat;DME Instruction;Gait training;Stair training;Functional mobility training;Therapeutic activities;Therapeutic exercise;Balance training;Neuromuscular re-education;Patient/family education;Manual techniques;Scar mobilization;Passive range of motion;Dry needling;Energy conservation;Taping    PT Next Visit Plan  Continue manual for edema and soft tissue restrictions, joint mobs for hip ROM, stretching.  Continue R/L and begin A/P rockerboard, strengthening as tolerated.  Begin sit to stands with equal stance phase next session (may need increased height initially).    PT Home Exercise Plan  eval: heel slides, supine HS stretch, standing lateral weight shifts       Patient will benefit from skilled therapeutic intervention in order to improve the following deficits and impairments:  Abnormal gait, Decreased activity tolerance, Decreased balance, Decreased endurance, Decreased mobility, Decreased range of motion, Decreased scar mobility, Decreased strength, Difficulty walking, Hypomobility, Increased edema, Increased fascial restricitons, Increased muscle spasms, Impaired flexibility,  Improper body mechanics, Pain  Visit Diagnosis: Stiffness of left hip, not elsewhere classified  Pain in left hip  Muscle weakness (generalized)  Difficulty in walking, not elsewhere classified   Problem List There are no active problems to display for this patient.  83 Del Monte StreetCasey Michaeljoseph Revolorio, LPTA; CBIS [phone]  Juel BurrowCockerham, Niza Soderholm Jo 07/07/2017, 11:59 AM  [facility] [address] [place], [place], [address] Phone: [phone]   Fax:  [phone]  Name: Troy GambleJerry W Burnstein MRN: [id number] Date of Birth: 08/19/1950

## 2017-07-14 ENCOUNTER — Ambulatory Visit (HOSPITAL_COMMUNITY): Payer: Medicare PPO | Admitting: Physical Therapy

## 2017-07-14 DIAGNOSIS — R262 Difficulty in walking, not elsewhere classified: Secondary | ICD-10-CM | POA: Diagnosis not present

## 2017-07-14 DIAGNOSIS — M6281 Muscle weakness (generalized): Secondary | ICD-10-CM

## 2017-07-14 DIAGNOSIS — M25652 Stiffness of left hip, not elsewhere classified: Secondary | ICD-10-CM

## 2017-07-14 DIAGNOSIS — M25552 Pain in left hip: Secondary | ICD-10-CM

## 2017-07-14 NOTE — Therapy (Signed)
[facility] [address] [place], [place], [address] Phone: [phone]   Fax:  [phone]  Physical Therapy Treatment  Patient Details  Name: [name] MRN: [id number] Date of Birth: 10/04/1950 Referring Provider: Fuller CanadaStanley Harrison, MD   Encounter Date: 07/14/2017  PT End of Session - 07/14/17 1510    Visit Number  3    Number of Visits  17    Date for PT Re-Evaluation  08/03/17    Authorization Type  [employer]    Authorization Time Period  07/06/17 to 08/31/17    Authorization - Visit Number  3    Authorization - Number of Visits  10    PT Start Time  1120    PT Stop Time  1200    PT Time Calculation (min)  40 min    Activity Tolerance  Patient tolerated treatment well    Behavior During Therapy  [facility] for tasks assessed/performed       No past medical history on file.  No past surgical history on file.  There were no vitals filed for this visit.  Subjective Assessment - 07/14/17 1504    Subjective  Pt states he has pain with certain movements and with weight bearing.  STill unable to balance his full weight on his Lt LE without UE assist.     Currently in Pain?  Yes    Pain Score  2     Pain Location  Leg    Pain Orientation  Left;Proximal    Pain Descriptors / Indicators  Aching;Stabbing;Throbbing                      [facility] Adult PT Treatment/Exercise - 07/14/17 1505      Ambulation/Gait   Ambulation Distance (Feet)  226 Feet    Assistive device  Rolling walker    Gait Pattern  Step-through pattern;Decreased arm swing - right;Decreased arm swing - left;Decreased step length - right;Decreased stance time - left;Decreased stride length;Decreased hip/knee flexion - left;Decreased dorsiflexion - left;Decreased weight shift to left;Antalgic      Knee/Hip Exercises: Stretches   Active Hamstring Stretch  2 reps;30 seconds      Knee/Hip Exercises: Standing   Forward Lunges  Both;10  reps;Limitations    Forward Lunges Limitations  4" step without UE assist    Hip Abduction  Both;10 reps    Lateral Step Up  Left;Hand Hold: 1;Step Height: 2"    Forward Step Up  Left;10 reps;Step Height: 2";Hand Hold: 1    Rocker Board  2 minutes;Limitations    Rocker Board Limitations  A/P and Rt/Lt      Manual Therapy   Manual Therapy  Edema management;Soft tissue mobilization    Manual therapy comments  Manual therapy complete separate than rest of tx    Edema Management  Retro massage with LE elevated    Soft tissue mobilization  quadriceps with LE elevated          Balance Exercises - 07/14/17 1508      Balance Exercises: Standing   Tandem Stance  Eyes open;Intermittent upper extremity support;2 reps;30 secs          PT Short Term Goals - 07/06/17 1636      PT SHORT TERM GOAL #1   Title  Pt will be independent with HEP in order to maximize overall recovery and function at home.    Time  4    Period  Weeks  Status  New    Target Date  08/03/17      PT SHORT TERM GOAL #2   Title  Pt will have improved L hip AROM by 10 deg in all planes in order to decrease pain and maximize pt's ability to tolerate sitting and standing for longer periods of time.     Time  4    Period  Weeks    Status  New      PT SHORT TERM GOAL #3   Title  Pt will have 1/2 grade improvement in MMT of all muscle groups tested in order to decrease pain and maximize ROM, gait, and balance    Time  4    Period  Weeks    Status  New        PT Long Term Goals - 07/06/17 1640      PT LONG TERM GOAL #1   Title  Pt will have L hip AROM WFL without pain in order to maximize his ability to squat down and pick up items off the ground/floor.     Time  8    Period  Weeks    Status  New    Target Date  08/31/17      PT LONG TERM GOAL #2   Title  Pt will have 1 grade improvement in MMT of all muscle groups tested in order to decrease pain and allow pt to perform all ADLs and IADLs with greater  ease.     Time  8    Period  Weeks    Status  New      PT LONG TERM GOAL #3   Title  Pt will be able to ambulate at least 8350ft during the 3MWT with LRAD, gait WFL, and no pain, in order to allow pt to return to strolling with his 3YO grandson.    Time  8    Period  Weeks    Status  New      PT LONG TERM GOAL #4   Title  Pt will be able to perform bil SLS for 10 sec with no UE in order to maximize pt's gait on uneven ground and allow pt to return to hiking/outdoor activities.     Time  8    Period  Weeks    Status  New      PT LONG TERM GOAL #5   Title  Pt will be able to perform the TUG in 12 sec or < with LRAD in order to demo improved balance and maximize his community access.    Time  8    Period  Weeks    Status  New      Additional Long Term Goals   Additional Long Term Goals  Yes      PT LONG TERM GOAL #6   Title  Pt will have improved 5xSTS to 12 sec or < to demo improved functional strength and overall balance.    Time  8    Period  Weeks    Status  New            Plan - 07/14/17 1510    Clinical Impression Statement  Continued to progress towards goals with increasing strength and decreasing soft tissue restrictions.  Instructed in alternate ways of completing hamstirng stretch in standing and long sitting.  Began tandem gait wtih abiltiy to maintain for 30 seconds and gentle functional strengthening for Lt LE.  Pt unable to maintain SLS  at this time on the Lt LE or complete gait wtihout AD.  Finished with manual to Lt hip and quad to decrease fascial restictions and general tightness.       Rehab Potential  Excellent    PT Frequency  2x / week    PT Duration  8 weeks    PT Treatment/Interventions  ADLs/Self Care Home Management;Cryotherapy;Electrical Stimulation;Moist Heat;DME Instruction;Gait training;Stair training;Functional mobility training;Therapeutic activities;Therapeutic exercise;Balance training;Neuromuscular re-education;Patient/family  education;Manual techniques;Scar mobilization;Passive range of motion;Dry needling;Energy conservation;Taping    PT Next Visit Plan  Continue manual for edema and soft tissue restrictions, joint mobs for hip ROM, stretching.  Attempt gait with SPC next session and begin sit to stands with equal stance phase.    PT Home Exercise Plan  eval: heel slides, supine HS stretch, standing lateral weight shifts       Patient will benefit from skilled therapeutic intervention in order to improve the following deficits and impairments:  Abnormal gait, Decreased activity tolerance, Decreased balance, Decreased endurance, Decreased mobility, Decreased range of motion, Decreased scar mobility, Decreased strength, Difficulty walking, Hypomobility, Increased edema, Increased fascial restricitons, Increased muscle spasms, Impaired flexibility, Improper body mechanics, Pain  Visit Diagnosis: Stiffness of left hip, not elsewhere classified  Pain in left hip  Muscle weakness (generalized)  Difficulty in walking, not elsewhere classified     Problem List There are no active problems to display for this patient.  Lurena Nida, PTA/CLT [phone]  Lurena Nida 07/14/2017, 3:14 PM  [place] [address] [place], [place], [address] Phone: [phone]   Fax:  [phone]  Name: [name] MRN: [id number] Date of Birth: Dec 18, 1950

## 2017-07-15 ENCOUNTER — Other Ambulatory Visit: Payer: Self-pay

## 2017-07-15 ENCOUNTER — Ambulatory Visit (HOSPITAL_COMMUNITY): Payer: Medicare PPO | Admitting: Physical Therapy

## 2017-07-15 ENCOUNTER — Encounter (HOSPITAL_COMMUNITY): Payer: Self-pay | Admitting: Physical Therapy

## 2017-07-15 DIAGNOSIS — M25652 Stiffness of left hip, not elsewhere classified: Secondary | ICD-10-CM | POA: Diagnosis not present

## 2017-07-15 DIAGNOSIS — M25552 Pain in left hip: Secondary | ICD-10-CM

## 2017-07-15 DIAGNOSIS — M6281 Muscle weakness (generalized): Secondary | ICD-10-CM

## 2017-07-15 DIAGNOSIS — R262 Difficulty in walking, not elsewhere classified: Secondary | ICD-10-CM | POA: Diagnosis not present

## 2017-07-15 NOTE — Therapy (Signed)
[facility] [address] [place], [place], [address] Phone: [phone]   Fax:  [phone]  Physical Therapy Treatment  Patient Details  Name: Troy GambleJerry W Ruotolo MRN: [id number] Date of Birth: 05/18/1951 Referring Provider: Fuller CanadaStanley Harrison, MD   Encounter Date: 07/15/2017  PT End of Session - 07/15/17 1219    Visit Number  65    Number of Visits  17    Date for PT Re-Evaluation  08/03/17    Authorization Type  [employer] CHO    Authorization Time Period  07/06/17 to 08/31/17    Authorization - Visit Number  65    Authorization - Number of Visits  10    PT Start Time  1116    PT Stop Time  1200    PT Time Calculation (min)  44 min    Equipment Utilized During Treatment  Other (comment) Rolling walker    Activity Tolerance  Patient tolerated treatment well    Behavior During Therapy  [facility] for tasks assessed/performed       History reviewed. No pertinent past medical history.  History reviewed. No pertinent surgical history.  There were no vitals filed for this visit.  Subjective Assessment - 07/15/17 1122    Subjective  Patient stated that the worst pain that he has is in his left thigh and it is a 1/10, but that he is not having any really significant pain. Patient stated that he is still having some difficulty with placing full weight on left lower extremity.    Currently in Pain?  Yes    Pain Score  1     Pain Location  Other (Comment) Thigh    Pain Orientation  Left    Pain Descriptors / Indicators  Aching    Multiple Pain Sites  No                      [facility] Adult PT Treatment/Exercise - 07/15/17 0001      Ambulation/Gait   Ambulation Distance (Feet)  226 Feet    Assistive device  Rolling walker    Gait Pattern  Step-through pattern;Decreased hip/knee flexion - left;Decreased weight shift to left;Decreased step length - right;Decreased stance time - left;Decreased arm swing - right;Decreased arm swing -  left;Decreased stride length    Gait Comments  Verbal cues for increased knee flexion and hip flexion on left side.       Knee/Hip Exercises: Stretches   Active Hamstring Stretch  2 reps;30 seconds      Knee/Hip Exercises: Standing   Forward Lunges  Both;10 reps;Limitations    Forward Lunges Limitations  4" step with UE assist    Hip Abduction  Both;10 reps    Lateral Step Up  Left;10 reps;Hand Hold: 2;Step Height: 4"    Rocker Board  2 minutes;Limitations    Rocker Board Limitations  Anterior to posterior and left to right      Knee/Hip Exercises: Supine   Quad Sets  10 reps;Strengthening;Left 5 second holds    Short Arc [employer] Tim[employer] CompanyQuad Sets  Strengthening;Left;10 reps without weight    Heel Slides  10 reps To promote edema reduction    Straight Leg Raises  10 reps;Strengthening Assessed for quadriceps lag, and cued to not lift as high    Other Supine Knee/Hip Exercises  Supine ankle pumps with lower extremities elevated to decrease edema      Manual Therapy   Manual Therapy  Soft tissue mobilization;Edema management  Manual therapy comments  Manual therapy complete separate than rest of tx    Edema Management  Light distal to proximal massage of left lower extremity for edema control    Soft tissue mobilization  Quadriceps muscle belly not on incision and hamstring insertions Incision appeared to be healing well, not inflamed          Balance Exercises - 07/14/17 1508      Balance Exercises: Standing   Tandem Stance  Eyes open;Intermittent upper extremity support;2 reps;30 secs        PT Education - 07/15/17 1218    Education provided  Yes    Education Details  Patient was educated about the quadriceps muscle this session and about continuing to perform exercises to reduce swelling such as ankle pumps.     Person(s) Educated  Patient    Methods  Explanation    Comprehension  Verbalized understanding       PT Short Term Goals - 07/06/17 1636      PT SHORT TERM GOAL #1    Title  Pt will be independent with HEP in order to maximize overall recovery and function at home.    Time  4    Period  Weeks    Status  New    Target Date  08/03/17      PT SHORT TERM GOAL #2   Title  Pt will have improved L hip AROM by 10 deg in all planes in order to decrease pain and maximize pt's ability to tolerate sitting and standing for longer periods of time.     Time  4    Period  Weeks    Status  New      PT SHORT TERM GOAL #3   Title  Pt will have 1/2 grade improvement in MMT of all muscle groups tested in order to decrease pain and maximize ROM, gait, and balance    Time  4    Period  Weeks    Status  New        PT Long Term Goals - 07/06/17 1640      PT LONG TERM GOAL #1   Title  Pt will have L hip AROM WFL without pain in order to maximize his ability to squat down and pick up items off the ground/floor.     Time  8    Period  Weeks    Status  New    Target Date  08/31/17      PT LONG TERM GOAL #2   Title  Pt will have 1 grade improvement in MMT of all muscle groups tested in order to decrease pain and allow pt to perform all ADLs and IADLs with greater ease.     Time  8    Period  Weeks    Status  New      PT LONG TERM GOAL #3   Title  Pt will be able to ambulate at least 869ft during the with LRAD, gait WFL, and no pain, in order to allow pt to return to strolling with his 3YO grandson.    Time  8    Period  Weeks    Status  New      PT LONG TERM GOAL #4   Title  Pt will be able to perform bil SLS for 10 sec with no UE in order to maximize pt's gait on uneven ground and allow pt to return to hiking/outdoor activities.  Time  8    Period  Weeks    Status  New      PT LONG TERM GOAL #5   Title  Pt will be able to perform the TUG in 12 sec or < with LRAD in order to demo improved balance and maximize his community access.    Time  8    Period  Weeks    Status  New      Additional Long Term Goals   Additional Long Term Goals  Yes      PT  LONG TERM GOAL #6   Title  Pt will have improved 5xSTS to 12 sec or < to demo improved functional strength and overall balance.    Time  8    Period  Weeks    Status  New            Plan - 07/15/17 1221    Clinical Impression Statement  This session focused on continuing to progress patient's strength, balance, and reduce edema. Patient tolerated all exercises well this session including supine quadriceps exercises to improve quadriceps strength. Noted patient continued to place less weight on left lower extremity during gait and patient was cued on flexing hip and knee during gait. During standing hip abduction patient was cued to keep pelvis level and not lift lower extremity as high and patient was able to correct. Performed tandem balance this session which patient stated was difficult, but which he performed well. Patient's incision was examined this session and it appeared to be healing well and no inflammation was noted. Noted swelling in patient's left ankle and continued with heel slides and ankle pumps in order to reduce edema. Performed soft tissue mobilization to quadriceps and to hamstrings to promote relaxation which was completed separately from rest of skilled interventions. Plan to continue with progression of strengthening, balance, and edema reduction.     Rehab Potential  Excellent    PT Frequency  2x / week    PT Duration  8 weeks    PT Treatment/Interventions  ADLs/Self Care Home Management;Cryotherapy;Electrical Stimulation;Moist Heat;DME Instruction;Gait training;Stair training;Functional mobility training;Therapeutic activities;Therapeutic exercise;Balance training;Neuromuscular re-education;Patient/family education;Manual techniques;Scar mobilization;Passive range of motion;Dry needling;Energy conservation;Taping    PT Next Visit Plan  Continue manual for edema and soft tissue restrictions, joint mobs for hip ROM, stretching.  Attempt gait with SPC next session and begin  sit to stands with equal stance phase.    PT Home Exercise Plan  eval: heel slides, supine HS stretch, standing lateral weight shifts    Consulted and Agree with Plan of Care  Patient       Patient will benefit from skilled therapeutic intervention in order to improve the following deficits and impairments:  Abnormal gait, Decreased activity tolerance, Decreased balance, Decreased endurance, Decreased mobility, Decreased range of motion, Decreased scar mobility, Decreased strength, Difficulty walking, Hypomobility, Increased edema, Increased fascial restricitons, Increased muscle spasms, Impaired flexibility, Improper body mechanics, Pain  Visit Diagnosis: Stiffness of left hip, not elsewhere classified  Pain in left hip  Muscle weakness (generalized)  Difficulty in walking, not elsewhere classified     Problem List There are no active problems to display for this patient.   Verne CarrowMacy Benzion Mesta PT, DPT 12:35 PM, 07/15/17 [phone]  [facility] [address] [place], [place], [address] Phone: [phone]   Fax:  [phone]  Name: Troy GambleJerry W Garno MRN: [id number] Date of Birth: 01/04/1951

## 2017-07-21 ENCOUNTER — Encounter (HOSPITAL_COMMUNITY): Payer: Self-pay

## 2017-07-21 ENCOUNTER — Ambulatory Visit (HOSPITAL_COMMUNITY): Payer: Medicare PPO | Attending: Orthopedic Surgery

## 2017-07-21 DIAGNOSIS — M25652 Stiffness of left hip, not elsewhere classified: Secondary | ICD-10-CM

## 2017-07-21 DIAGNOSIS — R262 Difficulty in walking, not elsewhere classified: Secondary | ICD-10-CM | POA: Diagnosis not present

## 2017-07-21 DIAGNOSIS — M6281 Muscle weakness (generalized): Secondary | ICD-10-CM

## 2017-07-21 DIAGNOSIS — M25552 Pain in left hip: Secondary | ICD-10-CM | POA: Diagnosis not present

## 2017-07-21 NOTE — Therapy (Signed)
[facility] [address] [place], [place], [address] Phone: [phone]   Fax:  [phone]  Physical Therapy Treatment  Patient Details  Name: [name] MRN: [id number] Date of Birth: 07/01/1951 Referring Provider: Fuller CanadaStanley Harrison, MD   Encounter Date: 07/21/2017  PT End of Session - 07/21/17 1434    Visit Number  5    Number of Visits  17    Date for PT Re-Evaluation  08/03/17    Authorization Type  [employer] CHO    Authorization Time Period  07/06/17 to 08/31/17    Authorization - Visit Number  5    Authorization - Number of Visits  10    PT Start Time  1431    PT Stop Time  1512    PT Time Calculation (min)  41 min    Equipment Utilized During Treatment  Other (comment) Rolling walker    Activity Tolerance  Patient tolerated treatment well    Behavior During Therapy  [facility] for tasks assessed/performed       History reviewed. No pertinent past medical history.  History reviewed. No pertinent surgical history.  There were no vitals filed for this visit.  Subjective Assessment - 07/21/17 1437    Subjective  Pt reports some soreness in his L quad, not pain, just muscle soreness.    Currently in Pain?  Yes    Pain Score  1     Pain Location  -- thigh    Pain Orientation  Left    Pain Descriptors / Indicators  Sore             [facility] Adult PT Treatment/Exercise - 07/21/17 0001      Knee/Hip Exercises: Stretches   Active Hamstring Stretch  Left;3 reps;30 seconds    Active Hamstring Stretch Limitations  standing with LLE on 8" step    Gastroc Stretch  Both;3 reps;30 seconds    Gastroc Stretch Limitations  on slant board      Knee/Hip Exercises: Standing   Lateral Step Up  Left;10 reps;Hand Hold: 2;Step Height: 6"    Forward Step Up  Left;10 reps;Hand Hold: 2;Step Height: 6"    Rocker Board  2 minutes    Rocker Board Limitations  A/P, R/L, no UE    SLS  5x10-15" holds    SLS with Vectors  x5RT on LLE only     Gait Training  x2 laps around gym with [facility]      Knee/Hip Exercises: Seated   Sit to Sand  10 reps;without UE support 2" step under RLE      Manual Therapy   Manual Therapy  Joint mobilization    Manual therapy comments  Manual therapy complete separate than rest of tx    Joint Mobilization  Grade II-III joint mobs performed for: L hip IR mobs with belt 10x10" bouts, L hip flexion joint mob 10x10-15" bouts; L hip extension joint mobs 10x10-15" bouts          PT Education - 07/21/17 1434    Education provided  Yes    Education Details  exercise technique, gait with SPC    Person(s) Educated  Patient    Methods  Explanation;Demonstration    Comprehension  Verbalized understanding;Returned demonstration       PT Short Term Goals - 07/06/17 1636      PT SHORT TERM GOAL #1   Title  Pt will be independent with HEP in order to maximize overall recovery  and function at home.    Time  4    Period  Weeks    Status  New    Target Date  08/03/17      PT SHORT TERM GOAL #2   Title  Pt will have improved L hip AROM by 10 deg in all planes in order to decrease pain and maximize pt's ability to tolerate sitting and standing for longer periods of time.     Time  4    Period  Weeks    Status  New      PT SHORT TERM GOAL #3   Title  Pt will have 1/2 grade improvement in MMT of all muscle groups tested in order to decrease pain and maximize ROM, gait, and balance    Time  4    Period  Weeks    Status  New        PT Long Term Goals - 07/06/17 1640      PT LONG TERM GOAL #1   Title  Pt will have L hip AROM WFL without pain in order to maximize his ability to squat down and pick up items off the ground/floor.     Time  8    Period  Weeks    Status  New    Target Date  08/31/17      PT LONG TERM GOAL #2   Title  Pt will have 1 grade improvement in MMT of all muscle groups tested in order to decrease pain and allow pt to perform all ADLs and IADLs with greater ease.     Time  8     Period  Weeks    Status  New      PT LONG TERM GOAL #3   Title  Pt will be able to ambulate at least 862ft during the with LRAD, gait WFL, and no pain, in order to allow pt to return to strolling with his 3YO grandson.    Time  8    Period  Weeks    Status  New      PT LONG TERM GOAL #4   Title  Pt will be able to perform bil SLS for 10 sec with no UE in order to maximize pt's gait on uneven ground and allow pt to return to hiking/outdoor activities.     Time  8    Period  Weeks    Status  New      PT LONG TERM GOAL #5   Title  Pt will be able to perform the TUG in 12 sec or < with LRAD in order to demo improved balance and maximize his community access.    Time  8    Period  Weeks    Status  New      Additional Long Term Goals   Additional Long Term Goals  Yes      PT LONG TERM GOAL #6   Title  Pt will have improved 5xSTS to 12 sec or < to demo improved functional strength and overall balance.    Time  8    Period  Weeks    Status  New            Plan - 07/21/17 1515    Clinical Impression Statement  Introduced pt gait with SPC this date. Pt did well only requiring min cues for sequencing; PT cleared pt to begin cane ambulation at home as his mechanics looked really good and  he did not have any pain or increased unsteadiness with it. Rest of session focused on strengthening and mobility of the L hip. He continues to be challenged with balance activities. General stiffness of L hip noted during joint mobs and should be continued.    Rehab Potential  Excellent    PT Frequency  2x / week    PT Duration  8 weeks    PT Treatment/Interventions  ADLs/Self Care Home Management;Cryotherapy;Electrical Stimulation;Moist Heat;DME Instruction;Gait training;Stair training;Functional mobility training;Therapeutic activities;Therapeutic exercise;Balance training;Neuromuscular re-education;Patient/family education;Manual techniques;Scar mobilization;Passive range of motion;Dry  needling;Energy conservation;Taping    PT Next Visit Plan  Continue manual for edema and soft tissue restrictions, joint mobs for hip ROM, stretching.  continue balance, STS with RLE elevated; add hip IR step arounds    PT Home Exercise Plan  eval: heel slides, supine HS stretch, standing lateral weight shifts    Consulted and Agree with Plan of Care  Patient       Patient will benefit from skilled therapeutic intervention in order to improve the following deficits and impairments:  Abnormal gait, Decreased activity tolerance, Decreased balance, Decreased endurance, Decreased mobility, Decreased range of motion, Decreased scar mobility, Decreased strength, Difficulty walking, Hypomobility, Increased edema, Increased fascial restricitons, Increased muscle spasms, Impaired flexibility, Improper body mechanics, Pain  Visit Diagnosis: Stiffness of left hip, not elsewhere classified  Pain in left hip  Muscle weakness (generalized)  Difficulty in walking, not elsewhere classified     Problem List There are no active problems to display for this patient.      Jac Canavan PT, DPT  [place] [facility] [address] [place], [place], [address] Phone: [phone]   Fax:  [phone]  Name: DELORIS MITTAG MRN: [id number] Date of Birth: 1951/07/10

## 2017-07-22 ENCOUNTER — Ambulatory Visit (HOSPITAL_COMMUNITY): Payer: Medicare PPO

## 2017-07-22 DIAGNOSIS — M6281 Muscle weakness (generalized): Secondary | ICD-10-CM | POA: Diagnosis not present

## 2017-07-22 DIAGNOSIS — R262 Difficulty in walking, not elsewhere classified: Secondary | ICD-10-CM | POA: Diagnosis not present

## 2017-07-22 DIAGNOSIS — M25552 Pain in left hip: Secondary | ICD-10-CM

## 2017-07-22 DIAGNOSIS — M25652 Stiffness of left hip, not elsewhere classified: Secondary | ICD-10-CM | POA: Diagnosis not present

## 2017-07-22 NOTE — Therapy (Signed)
[facility] [address] [place], [place], [address] Phone: [phone]   Fax:  [phone]  Physical Therapy Treatment  Patient Details  Name: Troy GambleJerry W Zeisler MRN: [id number] Date of Birth: 04/09/1951 Referring Provider: Fuller CanadaStanley Harrison, MD   Encounter Date: 07/22/2017  PT End of Session - 07/22/17 1523    Visit Number  6    Number of Visits  17    Date for PT Re-Evaluation  08/03/17    Authorization Type  [employer] CHO    Authorization Time Period  07/06/17 to 08/31/17    Authorization - Visit Number  6    Authorization - Number of Visits  10    PT Start Time  1441    PT Stop Time  1523    PT Time Calculation (min)  42 min    Activity Tolerance  Patient tolerated treatment well;No increased pain    Behavior During Therapy  [facility]       No past medical history on file.  No past surgical history on file.  There were no vitals filed for this visit.  Subjective Assessment - 07/22/17 1446    Subjective  Doing well, some soreness in hip after session yesterday but overall much improved.     Limitations  Sitting;Walking;Standing;House hold activities    Currently in Pain?  No/denies soreness in quads after last session                      [facility]/Exercise - 07/22/17 0001      Knee/Hip Exercises: Stretches   Active Hamstring Stretch  Left;30 seconds;2 reps    Gastroc Stretch  30 seconds;2 reps;Left    Gastroc Stretch Limitations  wall push    Other Knee/Hip Stretches  PROM: Hip IR Stretch at 90 degrees flexion, 2x30sec      Knee/Hip Exercises: Seated   Long Arc Quad  Left;2 sets;15 reps;Weights    Long Arc Quad Weight  8 lbs.    Other Seated Knee/Hip Exercises  half kneeling quads stretch left: 2x30sec    Sit to Sand  2 sets;10 reps      Manual Therapy   Manual Therapy  Myofascial release    Myofascial Release  MFR to glute med to improve IR; trigger point  release in mid belly vastuslateralis adn rectus femoris 17 minutes       Trigger Point Dry Needling - 07/22/17 1523    Consent Given?  Yes    Education Handout Provided  Yes    Muscles Treated Lower Body  Quadriceps    Quadriceps Response  Twitch response elicited;Palpable increased muscle length less than 5 minutes total           PT Education - 07/21/17 1434    Education provided  Yes    Education Details  exercise technique, gait with SPC    Person(s) Educated  Patient    Methods  Explanation;Demonstration    Comprehension  Verbalized understanding;Returned demonstration       PT Short Term Goals - 07/06/17 1636      PT SHORT TERM GOAL #1   Title  Pt will be independent with HEP in order to maximize overall recovery and function at home.    Time  4    Period  Weeks    Status  New    Target Date  08/03/17      PT SHORT TERM GOAL #2  Title  Pt will have improved L hip AROM by 10 deg in all planes in order to decrease pain and maximize pt's ability to tolerate sitting and standing for longer periods of time.     Time  4    Period  Weeks    Status  New      PT SHORT TERM GOAL #3   Title  Pt will have 1/2 grade improvement in MMT of all muscle groups tested in order to decrease pain and maximize ROM, gait, and balance    Time  4    Period  Weeks    Status  New        PT Long Term Goals - 07/06/17 1640      PT LONG TERM GOAL #1   Title  Pt will have L hip AROM WFL without pain in order to maximize his ability to squat down and pick up items off the ground/floor.     Time  8    Period  Weeks    Status  New    Target Date  08/31/17      PT LONG TERM GOAL #2   Title  Pt will have 1 grade improvement in MMT of all muscle groups tested in order to decrease pain and allow pt to perform all ADLs and IADLs with greater ease.     Time  8    Period  Weeks    Status  New      PT LONG TERM GOAL #3   Title  Pt will be able to ambulate at least 846ft during the  with LRAD, gait WFL, and no pain, in order to allow pt to return to strolling with his 3YO grandson.    Time  8    Period  Weeks    Status  New      PT LONG TERM GOAL #4   Title  Pt will be able to perform bil SLS for 10 sec with no UE in order to maximize pt's gait on uneven ground and allow pt to return to hiking/outdoor activities.     Time  8    Period  Weeks    Status  New      PT LONG TERM GOAL #5   Title  Pt will be able to perform the TUG in 12 sec or < with LRAD in order to demo improved balance and maximize his community access.    Time  8    Period  Weeks    Status  New      Additional Long Term Goals   Additional Long Term Goals  Yes      PT LONG TERM GOAL #6   Title  Pt will have improved 5xSTS to 12 sec or < to demo improved functional strength and overall balance.    Time  8    Period  Weeks    Status  New            Plan - 07/22/17 1524    Clinical Impression Statement  [facility] mobility and funcitonal strenght. noted psasm adn taut badn sin quads, pt agreeable to trial fo dry needling followed by MFR to quads, moderate loaded resistance and stretch. Pt notes good improvedment in stiffness adn pain. quads stretch updated for home to include rectus femoris. Did not heavily pursue IR mobility d/t hard end feel, but did address MFR restriction in th ehip during IR stretching.  Rehab Potential  Excellent    PT Frequency  2x / week    PT Duration  8 weeks    PT Treatment/Interventions  ADLs/Self Care Home Management;Cryotherapy;Electrical Stimulation;Moist Heat;DME Instruction;Gait training;Stair training;Functional mobility training;Therapeutic activities;Therapeutic exercise;Balance training;Neuromuscular re-education;Patient/family education;Manual techniques;Scar mobilization;Passive range of motion;Dry needling;Energy conservation;Taping    PT Next Visit Plan  Continue manual for edema and soft tissue restrictions, joint mobs for hip ROM,  stretching.  continue balance, STS with RLE elevated;     PT Home Exercise Plan  eval: heel slides, supine HS stretch, standing lateral weight shifts    Consulted and Agree with Plan of Care  Patient       Patient will benefit from skilled therapeutic intervention in order to improve the following deficits and impairments:  Abnormal gait, Decreased activity tolerance, Decreased balance, Decreased endurance, Decreased mobility, Decreased range of motion, Decreased scar mobility, Decreased strength, Difficulty walking, Hypomobility, Increased edema, Increased fascial restricitons, Increased muscle spasms, Impaired flexibility, Improper body mechanics, Pain  Visit Diagnosis: Stiffness of left hip, not elsewhere classified  Pain in left hip  Muscle weakness (generalized)  Difficulty in walking, not elsewhere classified     Problem List There are no active problems to display for this patient.  3:29 PM, 07/22/17 Rosamaria Lints, PT, DPT Physical Therapist at [facility] [phone] (office)      Rosamaria Lints 07/22/2017, 3:29 PM  [place] [facility] [address] [place], [place], [address] Phone: [phone]   Fax:  [phone]  Name: EMILEO SEMEL MRN: [id number] Date of Birth: 12/26/1950

## 2017-07-25 ENCOUNTER — Ambulatory Visit (HOSPITAL_COMMUNITY): Payer: Medicare PPO

## 2017-07-25 ENCOUNTER — Encounter (HOSPITAL_COMMUNITY): Payer: Self-pay

## 2017-07-25 DIAGNOSIS — M25552 Pain in left hip: Secondary | ICD-10-CM

## 2017-07-25 DIAGNOSIS — M25652 Stiffness of left hip, not elsewhere classified: Secondary | ICD-10-CM | POA: Diagnosis not present

## 2017-07-25 DIAGNOSIS — M6281 Muscle weakness (generalized): Secondary | ICD-10-CM

## 2017-07-25 DIAGNOSIS — R262 Difficulty in walking, not elsewhere classified: Secondary | ICD-10-CM

## 2017-07-25 NOTE — Therapy (Signed)
[facility] [address] [place], [place], [address] Phone: [phone]   Fax:  [phone]  Physical Therapy Treatment  Patient Details  Name: Troy GambleJerry W Howerton MRN: [id number] Date of Birth: 02/09/1951 Referring Provider: Fuller CanadaStanley Harrison, MD   Encounter Date: 07/25/2017  PT End of Session - 07/25/17 1426    Visit Number  7    Number of Visits  17    Date for PT Re-Evaluation  08/03/17    Authorization Type  [employer] CHO    Authorization Time Period  07/06/17 to 08/31/17    Authorization - Visit Number  7    Authorization - Number of Visits  10    PT Start Time  1425    PT Stop Time  1506    PT Time Calculation (min)  41 min    Activity Tolerance  Patient tolerated treatment well;No increased pain    Behavior During Therapy  [facility] for tasks assessed/performed       History reviewed. No pertinent past medical history.  History reviewed. No pertinent surgical history.  There were no vitals filed for this visit.  Subjective Assessment - 07/25/17 1426    Subjective  Pt states that he was sore following the dry needling but he states that he feels like it worked because his quad does not feel as tight. No pain, just soreness and he wouldn't rate it.    Limitations  Sitting;Walking;Standing;House hold activities    Currently in Pain?  No/denies           [facility] - 07/25/17 0001      Knee/Hip Exercises: Stretches   Active Hamstring Stretch  Left;3 reps;30 seconds    Active Hamstring Stretch Limitations  standing with LLE on 8" step    Quad Stretch  Left;3 reps;30 seconds    Quad Stretch Limitations  prone with rope    Gastroc Stretch  Both;3 reps;30 seconds    Gastroc Stretch Limitations  slant board    Other Knee/Hip Stretches  prone press ups with RLE off table for hip ext 10x10-15" holds      Knee/Hip Exercises: Standing   Hip Abduction  Both;2 sets;10 reps    Abduction Limitations  BLE, GTB     Functional Squat  2 sets;15 reps    Functional Squat Limitations  with tactile feedback to improve weight shift over LLE    SLS with Vectors  x5RT on LLE only    Other Standing Knee Exercises  cone taps bil x5RT      Knee/Hip Exercises: Seated   Sit to Sand  2 sets;10 reps;without UE support RLE elevatedon 2" step      Knee/Hip Exercises: Supine   Bridges  Both;15 reps 2-3" holds      Knee/Hip Exercises: Sidelying   Clams  L clams x15 reps with GTB      Manual Therapy   Manual Therapy  Joint mobilization    Manual therapy comments  Manual therapy complete separate than rest of tx    Joint Mobilization  Grade III PA joint mobs with belt for hip extension 10x10-15" bouts each    Soft tissue mobilization  manual hip extension PROM/hip flexion stretch 5x15-20" bouts each           PT Education - 07/25/17 1430    Education provided  Yes    Education Details  exercise technique, update HEP    Person(s) Educated  Patient  Methods  Explanation;Demonstration;Handout    Comprehension  Verbalized understanding;Returned demonstration       PT Short Term Goals - 07/06/17 1636      PT SHORT TERM GOAL #1   Title  Pt will be independent with HEP in order to maximize overall recovery and function at home.    Time  4    Period  Weeks    Status  New    Target Date  08/03/17      PT SHORT TERM GOAL #2   Title  Pt will have improved L hip AROM by 10 deg in all planes in order to decrease pain and maximize pt's ability to tolerate sitting and standing for longer periods of time.     Time  4    Period  Weeks    Status  New      PT SHORT TERM GOAL #3   Title  Pt will have 1/2 grade improvement in MMT of all muscle groups tested in order to decrease pain and maximize ROM, gait, and balance    Time  4    Period  Weeks    Status  New        PT Long Term Goals - 07/06/17 1640      PT LONG TERM GOAL #1   Title  Pt will have L hip AROM WFL without pain in order to maximize his  ability to squat down and pick up items off the ground/floor.     Time  8    Period  Weeks    Status  New    Target Date  08/31/17      PT LONG TERM GOAL #2   Title  Pt will have 1 grade improvement in MMT of all muscle groups tested in order to decrease pain and allow pt to perform all ADLs and IADLs with greater ease.     Time  8    Period  Weeks    Status  New      PT LONG TERM GOAL #3   Title  Pt will be able to ambulate at least 830ft during the with LRAD, gait WFL, and no pain, in order to allow pt to return to strolling with his 3YO grandson.    Time  8    Period  Weeks    Status  New      PT LONG TERM GOAL #4   Title  Pt will be able to perform bil SLS for 10 sec with no UE in order to maximize pt's gait on uneven ground and allow pt to return to hiking/outdoor activities.     Time  8    Period  Weeks    Status  New      PT LONG TERM GOAL #5   Title  Pt will be able to perform the TUG in 12 sec or < with LRAD in order to demo improved balance and maximize his community access.    Time  8    Period  Weeks    Status  New      Additional Long Term Goals   Additional Long Term Goals  Yes      PT LONG TERM GOAL #6   Title  Pt will have improved 5xSTS to 12 sec or < to demo improved functional strength and overall balance.    Time  8    Period  Weeks    Status  New  Plan - 07/25/17 1509    Clinical Impression Statement  Continued with strengthening and mobility work this date. Pt tolerated all exercises well with no reports of L hip pain. Updated pt's HEP to include hip strengthening and balance work. No pain at EOS, just some fatigue verbalized. Continue and progress as planned.    Rehab Potential  Excellent    PT Frequency  2x / week    PT Duration  8 weeks    PT Treatment/Interventions  ADLs/Self Care Home Management;Cryotherapy;Electrical Stimulation;Moist Heat;DME Instruction;Gait training;Stair training;Functional mobility training;Therapeutic  activities;Therapeutic exercise;Balance training;Neuromuscular re-education;Patient/family education;Manual techniques;Scar mobilization;Passive range of motion;Dry needling;Energy conservation;Taping    PT Next Visit Plan  Continue manual for edema and soft tissue restrictions, joint mobs for hip ROM, stretching.  continue balance, STS with RLE elevated; continue MFR to glute med PRN, add weights to STS    PT Home Exercise Plan  eval: heel slides, supine HS stretch, standing lateral weight shifts; 1/7: SLS, SLS with vectors, bridging, s/l clams with GTB    Consulted and Agree with Plan of Care  Patient       Patient will benefit from skilled therapeutic intervention in order to improve the following deficits and impairments:  Abnormal gait, Decreased activity tolerance, Decreased balance, Decreased endurance, Decreased mobility, Decreased range of motion, Decreased scar mobility, Decreased strength, Difficulty walking, Hypomobility, Increased edema, Increased fascial restricitons, Increased muscle spasms, Impaired flexibility, Improper body mechanics, Pain  Visit Diagnosis: Stiffness of left hip, not elsewhere classified  Pain in left hip  Muscle weakness (generalized)  Difficulty in walking, not elsewhere classified     Problem List There are no active problems to display for this patient.     Jac Canavan PT, DPT  [facility] [address] [place], [place], [address] Phone: [phone]   Fax:  [phone]  Name: KYSHAUN BARNETTE MRN: [id number] Date of Birth: 1951-03-28

## 2017-07-25 NOTE — Patient Instructions (Signed)
  SINGLE LEG STANCE - SLS  Stand on one leg and maintain your balance.  After standing on the LLE only for a few rounds, switch to kicking your R leg forward sideways, and backwards, trying to hold for 3-5 seconds at each spot    BRIDGING  While lying on your back with knees bent, tighten your lower abdominals, squeeze your buttocks and then raise your buttocks off the floor/bed as creating a "Bridge" with your body. Hold and then lower yourself and repeat.  Perform 1x/day, 2-3 sets of 10-15 reps   ELASTIC BAND - SIDELYING CLAM - CLAMSHELL   While lying on your side with your knees bent and an elastic band wrapped around your knees, draw up the top knee while keeping contact of your feet together as shown.   Do not let your pelvis roll back during the lifting movement.    Perform 1x/day, 2-3 sets of 10-15 reps with the green band

## 2017-07-28 ENCOUNTER — Encounter (HOSPITAL_COMMUNITY): Payer: Self-pay

## 2017-07-28 ENCOUNTER — Ambulatory Visit (HOSPITAL_COMMUNITY): Payer: Medicare PPO

## 2017-07-28 DIAGNOSIS — R944 Abnormal results of kidney function studies: Secondary | ICD-10-CM | POA: Diagnosis not present

## 2017-07-28 DIAGNOSIS — M25652 Stiffness of left hip, not elsewhere classified: Secondary | ICD-10-CM | POA: Diagnosis not present

## 2017-07-28 DIAGNOSIS — I10 Essential (primary) hypertension: Secondary | ICD-10-CM | POA: Diagnosis not present

## 2017-07-28 DIAGNOSIS — M25552 Pain in left hip: Secondary | ICD-10-CM | POA: Diagnosis not present

## 2017-07-28 DIAGNOSIS — E119 Type 2 diabetes mellitus without complications: Secondary | ICD-10-CM | POA: Diagnosis not present

## 2017-07-28 DIAGNOSIS — M6281 Muscle weakness (generalized): Secondary | ICD-10-CM

## 2017-07-28 DIAGNOSIS — R262 Difficulty in walking, not elsewhere classified: Secondary | ICD-10-CM | POA: Diagnosis not present

## 2017-07-28 DIAGNOSIS — N401 Enlarged prostate with lower urinary tract symptoms: Secondary | ICD-10-CM | POA: Diagnosis not present

## 2017-07-28 DIAGNOSIS — N200 Calculus of kidney: Secondary | ICD-10-CM | POA: Diagnosis not present

## 2017-07-28 NOTE — Therapy (Signed)
[facility] [address] [place], [place], [address] Phone: [phone]   Fax:  [phone]  Physical Therapy Treatment  Patient Details  Name: [name] MRN: [id number] Date of Birth: 02/09/1964 Referring Provider: Fuller [place], MD   Encounter Date: 07/28/2017  PT End of Session - 07/28/17 1446    Visit Number  8    Number of Visits  17    Date for PT Re-Evaluation  08/03/17    Authorization Type  [employer] CHO    Authorization Time Period  07/06/17 to 08/31/17    Authorization - Visit Number  8    Authorization - Number of Visits  10    PT Start Time  1434    PT Stop Time  1518    PT Time Calculation (min)  44 min    Activity Tolerance  Patient tolerated treatment well;No increased pain    Behavior During Therapy  [facility] for tasks assessed/performed       History reviewed. No pertinent past medical history.  History reviewed. No pertinent surgical history.  There were no vitals filed for this visit.  Subjective Assessment - 07/28/17 1444    Subjective  Pt reports he is feeling better every day, no reports of pain just soreness.  Most difficulty with balance currently but feels it is improving.    Patient Stated Goals  get back to where he was    Currently in Pain?  No/denies           [facility] Adult PT Treatment/Exercise - 07/28/17 0001      Knee/Hip Exercises: Stretches   Active Hamstring Stretch  Left;3 reps;30 seconds    Active Hamstring Stretch Limitations  supine    Quad Stretch  Left;3 reps;30 seconds    Quad Stretch Limitations  prone with rope    Other Knee/Hip Stretches  PROM: Hip IR stretch 2x 30"    Other Knee/Hip Stretches  prone press ups with RLE off table for hip ext 10x10-15" holds      Knee/Hip Exercises: Standing   Functional Squat  2 sets;15 reps    Functional Squat Limitations  with tactile feedback to improve weight shift over LLE    SLS  5x10-15" holds    SLS with Vectors  3x 5" on  Lt LE only    Other Standing Knee Exercises  cone taps bil x5RT    Other Standing Knee Exercises  3D hip excursion 10x (2in step under Rt LE wiht squats to improve stance phase; sidestep with GTB 2RT      Knee/Hip Exercises: Supine   Bridges  Both;15 reps GTB 5" holds               PT Short Term Goals - 07/06/17 1636      PT SHORT TERM GOAL #1   Title  Pt will be independent with HEP in order to maximize overall recovery and function at home.    Time  4    Period  Weeks    Status  New    Target Date  08/03/17      PT SHORT TERM GOAL #2   Title  Pt will have improved L hip AROM by 10 deg in all planes in order to decrease pain and maximize pt's ability to tolerate sitting and standing for longer periods of time.     Time  4    Period  Weeks    Status  New  PT SHORT TERM GOAL #3   Title  Pt will have 1/2 grade improvement in MMT of all muscle groups tested in order to decrease pain and maximize ROM, gait, and balance    Time  4    Period  Weeks    Status  New        PT Long Term Goals - 07/06/17 1640      PT LONG TERM GOAL #1   Title  Pt will have L hip AROM WFL without pain in order to maximize his ability to squat down and pick up items off the ground/floor.     Time  8    Period  Weeks    Status  New    Target Date  08/31/17      PT LONG TERM GOAL #2   Title  Pt will have 1 grade improvement in MMT of all muscle groups tested in order to decrease pain and allow pt to perform all ADLs and IADLs with greater ease.     Time  8    Period  Weeks    Status  New      PT LONG TERM GOAL #3   Title  Pt will be able to ambulate at least 869[address]ft during the 3MWT with LRAD, gait WFL, and no pain, in order to allow pt to return to strolling with his 3YO grandson.    Time  8    Period  Weeks    Status  New      PT LONG TERM GOAL #4   Title  Pt will be able to perform bil SLS for 10 sec with no UE in order to maximize pt's gait on uneven ground and allow pt to return  to hiking/outdoor activities.     Time  8    Period  Weeks    Status  New      PT LONG TERM GOAL #5   Title  Pt will be able to perform the TUG in 12 sec or < with LRAD in order to demo improved balance and maximize his community access.    Time  8    Period  Weeks    Status  New      Additional Long Term Goals   Additional Long Term Goals  Yes      PT LONG TERM GOAL #6   Title  Pt will have improved 5xSTS to 12 sec or < to demo improved functional strength and overall balance.    Time  8    Period  Weeks    Status  New            Plan - 07/28/17 1526    Clinical Impression Statement  Session focus with hip mobility and strengthening/balance training.  Added 3D hip excursion to address hip mobility and sidestep for glut med strengthening.  Pt limited by fatigue wiht new activities, no reports of increased pain through session.  Balance continues to be impaired, required HHA/ min guard for safety with balance activites.    Rehab Potential  Excellent    PT Frequency  2x / week    PT Duration  8 weeks    PT Treatment/Interventions  ADLs/Self Care Home Management;Cryotherapy;Electrical Stimulation;Moist Heat;DME Instruction;Gait training;Stair training;Functional mobility training;Therapeutic activities;Therapeutic exercise;Balance training;Neuromuscular re-education;Patient/family education;Manual techniques;Scar mobilization;Passive range of motion;Dry needling;Energy conservation;Taping    PT Next Visit Plan  Continue session foucs with hip mobility, functional strengthening and balance training.  Continue manual PROM/joint mobs, MFR  to Glut med, STM and edema control PRN.    PT Home Exercise Plan  eval: heel slides, supine HS stretch, standing lateral weight shifts; 1/7: SLS, SLS with vectors, bridging, s/l clams with GTB       Patient will benefit from skilled therapeutic intervention in order to improve the following deficits and impairments:  Abnormal gait, Decreased activity  tolerance, Decreased balance, Decreased endurance, Decreased mobility, Decreased range of motion, Decreased scar mobility, Decreased strength, Difficulty walking, Hypomobility, Increased edema, Increased fascial restricitons, Increased muscle spasms, Impaired flexibility, Improper body mechanics, Pain  Visit Diagnosis: Stiffness of left hip, not elsewhere classified  Pain in left hip  Muscle weakness (generalized)  Difficulty in walking, not elsewhere classified     Problem List There are no active problems to display for this patient.  [address] LPTA; CBIS [phone]  Juel Burrow 07/28/2017, 3:57 PM  [place] [facility] [address] [place], [place], [address] Phone: [phone]   Fax:  [phone]  Name: BARCLAY LENNOX MRN: [id number] Date of Birth: 08-09-1950

## 2017-07-29 ENCOUNTER — Telehealth (HOSPITAL_COMMUNITY): Payer: Self-pay | Admitting: Physical Therapy

## 2017-07-29 NOTE — Telephone Encounter (Signed)
L/M requested to move pt to 3:15pm today- need to get a lymph pt in with Amy F. NF 07/29/17 @ 3:28pm

## 2017-08-01 ENCOUNTER — Ambulatory Visit (HOSPITAL_COMMUNITY): Payer: Medicare PPO

## 2017-08-01 DIAGNOSIS — S72143A Displaced intertrochanteric fracture of unspecified femur, initial encounter for closed fracture: Secondary | ICD-10-CM | POA: Diagnosis not present

## 2017-08-01 DIAGNOSIS — M6281 Muscle weakness (generalized): Secondary | ICD-10-CM | POA: Diagnosis not present

## 2017-08-01 DIAGNOSIS — Z87442 Personal history of urinary calculi: Secondary | ICD-10-CM | POA: Diagnosis not present

## 2017-08-01 DIAGNOSIS — M25552 Pain in left hip: Secondary | ICD-10-CM | POA: Diagnosis not present

## 2017-08-01 DIAGNOSIS — I10 Essential (primary) hypertension: Secondary | ICD-10-CM | POA: Diagnosis not present

## 2017-08-01 DIAGNOSIS — M25652 Stiffness of left hip, not elsewhere classified: Secondary | ICD-10-CM

## 2017-08-01 DIAGNOSIS — R944 Abnormal results of kidney function studies: Secondary | ICD-10-CM | POA: Diagnosis not present

## 2017-08-01 DIAGNOSIS — R262 Difficulty in walking, not elsewhere classified: Secondary | ICD-10-CM

## 2017-08-01 DIAGNOSIS — R972 Elevated prostate specific antigen [PSA]: Secondary | ICD-10-CM | POA: Diagnosis not present

## 2017-08-01 DIAGNOSIS — E119 Type 2 diabetes mellitus without complications: Secondary | ICD-10-CM | POA: Diagnosis not present

## 2017-08-01 DIAGNOSIS — H8149 Vertigo of central origin, unspecified ear: Secondary | ICD-10-CM | POA: Diagnosis not present

## 2017-08-01 NOTE — Therapy (Signed)
[place], [place], [address] Phone: [phone]   Fax:  [phone]  Physical Therapy Treatment/Reassessment  Patient Details  Name: [name] MRN: [id number] Date of Birth: 05/26/1951 Referring Provider: Arther Abbott, MD   Encounter Date: 08/01/2017  PT End of Session - 08/01/17 1430    Visit Number  9    Number of Visits  17    Date for PT Re-Evaluation  08/31/17    Authorization Type  [employer]    Authorization Time Period  07/06/17 to 08/31/17    Authorization - Visit Number  --    Authorization - Number of Visits  --    PT Start Time  1430    PT Stop Time  1510    PT Time Calculation (min)  40 min    Activity Tolerance  Patient tolerated treatment well;No increased pain    Behavior During Therapy  [facility] for tasks assessed/performed       No past medical history on file.  No past surgical history on file.  There were no vitals filed for this visit.  Subjective Assessment - 08/01/17 1431    Subjective  Pt states that he is having some trouble with his vertigo today, which he states started last Friday. He said that he woke up Friday morning and fell due to the dizziness. His hip stays sore but he can do more with it every day.    Patient Stated Goals  get back to where he was    Currently in Pain?  No/denies         [facility] PT Assessment - 08/01/17 0001      AROM   Left Hip Extension  7 was -11    Left Hip Flexion  112 was 96    Left Hip External Rotation   29 was 16    Left Hip Internal Rotation   20 was 3    Left Hip ABduction  35 was 35      Strength   Right Hip Extension  4+/5 was 4+    Left Hip Flexion  5/5 was 3+    Left Hip Extension  4-/5 was 2+    Left Hip External Rotation  4+/5 was 3-    Left Hip Internal Rotation  4/5 was 4-    Left Hip ABduction  3+/5 was 2+    Left Knee Flexion  5/5 was 4    Left Knee Extension  5/5 was 4+    Left Ankle Dorsiflexion  5/5 was  4+      Ambulation/Gait   Ambulation Distance (Feet)  700 Feet 3MWT; was 324f with RW    Assistive device  Straight cane    Gait Pattern  Step-through pattern;Decreased stance time - left;Trendelenburg;Antalgic L foot ER, decr L hip ext, incr L pelvic anterior rotation      Balance   Balance Assessed  Yes      Static Standing Balance   Static Standing - Balance Support  No upper extremity supported;Right upper extremity supported    Static Standing Balance -  Activities   Single Leg Stance - Right Leg;Single Leg Stance - Left Leg    Static Standing - Comment/# of Minutes  R: 6 sec L: 8 sec (trunk lean, airplanig arms)      Standardized Balance Assessment   Standardized Balance Assessment  Five Times Sit to Stand;Timed Up and Go Test    Five times  sit to stand comments   11 sec, no UE, increased weight shift to the R was 20.6sec      Timed Up and Go Test   TUG  Normal TUG    Normal TUG (seconds)  10.99 with SPC; was 20.04 with RW           [facility] Adult PT Treatment/Exercise - 08/01/17 0001      Knee/Hip Exercises: Supine   Bridges  Both;15 reps    Bridges Limitations  GTB, 2-3" holds      Knee/Hip Exercises: Sidelying   Clams  L clams x15 reps with GTB           PT Education - 08/01/17 1431    Education provided  Yes    Education Details  reassessment findings    Person(s) Educated  Patient    Methods  Explanation;Demonstration    Comprehension  Returned demonstration;Verbalized understanding       PT Short Term Goals - 08/01/17 1434      PT SHORT TERM GOAL #1   Title  Pt will be independent with HEP in order to maximize overall recovery and function at home.    Time  4    Period  Weeks    Status  Achieved      PT SHORT TERM GOAL #2   Title  Pt will have improved L hip AROM by 10 deg in all planes in order to decrease pain and maximize pt's ability to tolerate sitting and standing for longer periods of time.     Time  4    Period  Weeks    Status   Achieved      PT SHORT TERM GOAL #3   Title  Pt will have 1/2 grade improvement in MMT of all muscle groups tested in order to decrease pain and maximize ROM, gait, and balance    Time  4    Period  Weeks    Status  Achieved        PT Long Term Goals - 08/01/17 1435      PT LONG TERM GOAL #1   Title  Pt will have L hip AROM WFL without pain in order to maximize his ability to squat down and pick up items off the ground/floor.     Time  8    Period  Weeks    Status  Achieved      PT LONG TERM GOAL #2   Title  Pt will have 1 grade improvement in MMT of all muscle groups tested in order to decrease pain and allow pt to perform all ADLs and IADLs with greater ease.     Time  8    Period  Weeks    Status  Partially Met      PT LONG TERM GOAL #3   Title  Pt will be able to ambulate at least 83f during the 3MWT with LRAD, gait WFL, and no pain, in order to allow pt to return to strolling with his 3YO grandson.    Baseline  1/14: 7055fwith SPC, no pain just L hip tightness    Time  8    Period  Weeks    Status  On-going      PT LONG TERM GOAL #4   Title  Pt will be able to perform bil SLS for 10 sec with no UE in order to maximize pt's gait on uneven ground and allow pt to return to hiking/outdoor activities.  Baseline  1/14: R: 6 sec, L: 8 sec with compensations    Time  8    Period  Weeks    Status  On-going      PT LONG TERM GOAL #5   Title  Pt will be able to perform the TUG in 12 sec or < with LRAD in order to demo improved balance and maximize his community access.    Baseline  1/14: 10.99sec with SPC    Time  8    Period  Weeks    Status  Partially Met      PT LONG TERM GOAL #6   Title  Pt will have improved 5xSTS to 12 sec or < to demo improved functional strength and overall balance.    Baseline  1/14: 11 sec, improper mechanics    Time  8    Period  Weeks    Status  Partially Met            Plan - 08/01/17 1520    Clinical Impression Statement  PT  reassessed pt's goals and outcome measures this date. Pt has made great progress thus far as he has met all STG and partially met most of his LTG, while the others are on-going. His L hip AROM has improved significantly and PT feels he is [facility] for his ROM at this point. His MMT has improved greatly but his main strength deficits remain in his L abductors and extensors and should be a focus of his POC going forward. His functional strength has improved though it is still deficient AEB his form with 5xSTS and gait mechanics. Overall, pt is doing great and his POC will be continued going forward. Pt dealing with vertigo this date and required increased time to complete transfers and bed mobility. He was noted to have nystagmus with certain movements. He saw his doctor regarding this who gave him some exercises; educated pt that we can treat this in his upcoming sessions if he does not feel any better as it will likely impact his therapy sessions for his hip. Added to POC and sent updated cert to MD to include vestibular treatments.    Rehab Potential  Excellent    PT Frequency  2x / week    PT Duration  8 weeks    PT Treatment/Interventions  ADLs/Self Care Home Management;Cryotherapy;Electrical Stimulation;Moist Heat;DME Instruction;Gait training;Stair training;Functional mobility training;Therapeutic activities;Therapeutic exercise;Balance training;Neuromuscular re-education;Patient/family education;Manual techniques;Scar mobilization;Passive range of motion;Dry needling;Energy conservation;Taping;Vestibular    PT Next Visit Plan  Continue to focus on functional strengthening and balance training. Hip abd and ext strengthening; MFR to Glut med, STM and edema control PRN.    PT Home Exercise Plan  eval: heel slides, supine HS stretch, standing lateral weight shifts; 1/7: SLS, SLS with vectors, bridging, s/l clams with GTB    Consulted and Agree with Plan of Care  Patient       Patient will benefit from  skilled therapeutic intervention in order to improve the following deficits and impairments:  Abnormal gait, Decreased activity tolerance, Decreased balance, Decreased endurance, Decreased mobility, Decreased range of motion, Decreased scar mobility, Decreased strength, Difficulty walking, Hypomobility, Increased edema, Increased fascial restricitons, Increased muscle spasms, Impaired flexibility, Improper body mechanics, Pain  Visit Diagnosis: Stiffness of left hip, not elsewhere classified - Plan: PT plan of care cert/re-cert  Pain in left hip - Plan: PT plan of care cert/re-cert  Muscle weakness (generalized) - Plan: PT plan of care cert/re-cert  Difficulty in walking, not  elsewhere classified - Plan: PT plan of care cert/re-cert     Problem List There are no active problems to display for this patient.     Geraldine Solar PT, [place] [address] [place], [place], [address] Phone: [phone]   Fax:  [phone]  Name: [name] MRN: [id number] Date of Birth: Oct 21, 1950

## 2017-08-02 DIAGNOSIS — S72142D Displaced intertrochanteric fracture of left femur, subsequent encounter for closed fracture with routine healing: Secondary | ICD-10-CM | POA: Insufficient documentation

## 2017-08-03 ENCOUNTER — Ambulatory Visit (INDEPENDENT_AMBULATORY_CARE_PROVIDER_SITE_OTHER): Payer: Medicare PPO

## 2017-08-03 ENCOUNTER — Ambulatory Visit (INDEPENDENT_AMBULATORY_CARE_PROVIDER_SITE_OTHER): Payer: Self-pay | Admitting: Orthopedic Surgery

## 2017-08-03 ENCOUNTER — Encounter: Payer: Self-pay | Admitting: Orthopedic Surgery

## 2017-08-03 DIAGNOSIS — S72145D Nondisplaced intertrochanteric fracture of left femur, subsequent encounter for closed fracture with routine healing: Secondary | ICD-10-CM | POA: Diagnosis not present

## 2017-08-03 NOTE — Patient Instructions (Signed)
Continue physical therapy  Return in 6 weeks for x-ray

## 2017-08-03 NOTE — Progress Notes (Signed)
Fracture care follow-up  Chief Complaint  Patient presents with  . Post-op Follow-up    [place]    45 days / 6 weeks and 3 days post open treatment internal fixation left hip  The patient has no significant pain in his hip he does have a limp he is using a cane he says he does get tired  [facility] is [facility] and [place]  His incision looks good his hip flexion is greater than 120 degrees he is walking with a limp and a cane his x-ray shows fracture is healing nicely no hardware complications are noted he has a previous femur deformity from a previous fracture treated with traction  He is doing well he will follow-up in 6 weeks for repeat x-ray we would like him to continue his physical therapy to strengthen his hip

## 2017-08-04 ENCOUNTER — Ambulatory Visit (HOSPITAL_COMMUNITY): Payer: Medicare PPO | Admitting: Physical Therapy

## 2017-08-04 DIAGNOSIS — M6281 Muscle weakness (generalized): Secondary | ICD-10-CM | POA: Diagnosis not present

## 2017-08-04 DIAGNOSIS — M25552 Pain in left hip: Secondary | ICD-10-CM

## 2017-08-04 DIAGNOSIS — M25652 Stiffness of left hip, not elsewhere classified: Secondary | ICD-10-CM

## 2017-08-04 DIAGNOSIS — R262 Difficulty in walking, not elsewhere classified: Secondary | ICD-10-CM | POA: Diagnosis not present

## 2017-08-04 NOTE — Therapy (Signed)
[place], [place], [address] Phone: [phone]   Fax:  [phone]  Physical Therapy Treatment  Patient Details  Name: [name] MRN: [id number] Date of Birth: [birth year] Referring Provider: Arther Abbott, MD   Encounter Date: 08/04/2017  PT End of Session - 08/04/17 1541    Visit Number  10    Number of Visits  17    Date for PT Re-Evaluation  08/31/17    Authorization Type  [employer] CHO    Authorization Time Period  07/06/17 to 08/31/17    PT Start Time  1436    PT Stop Time  1515    PT Time Calculation (min)  39 min    Activity Tolerance  Patient tolerated treatment well;No increased pain    Behavior During Therapy  [facility] for tasks assessed/performed       No past medical history on file.  No past surgical history on file.  There were no vitals filed for this visit.  Subjective Assessment - 08/04/17 1547    Subjective  Pt reports MD appt this morning and overall pleased with progress.  Pt is to return in 6 weeks and continue therapy as needed.  States his vertigo is getting better but still having trouble with transition supine to sit.     Currently in Pain?  No/denies                      [facility] Adult PT Treatment/Exercise - 08/04/17 0001      Knee/Hip Exercises: Standing   Functional Squat  2 sets;15 reps    Functional Squat Limitations  with yellow weighted ball; tactile feedback to improve weight shift over LLE    SLS  Lt 30"     SLS with Vectors  10X 5" on Lt LE only    Other Standing Knee Exercises  tandem gait on foam 3# bar flexion, palov press on foam     Other Standing Knee Exercises  sidestepping with GTB 2RT          Balance Exercises - 08/04/17 1501      Balance Exercises: Standing   Tandem Gait  2 reps;Foam/compliant surface    Sidestepping  2 reps;Foam/compliant support    Step Over Hurdles / Cones  2RT 3 6" hurdles sideways and tandem on foam           PT Short Term Goals - 08/01/17 1434      PT SHORT TERM GOAL #1   Title  Pt will be independent with HEP in order to maximize overall recovery and function at home.    Time  4    Period  Weeks    Status  Achieved      PT SHORT TERM GOAL #2   Title  Pt will have improved L hip AROM by 10 deg in all planes in order to decrease pain and maximize pt's ability to tolerate sitting and standing for longer periods of time.     Time  4    Period  Weeks    Status  Achieved      PT SHORT TERM GOAL #3   Title  Pt will have 1/2 grade improvement in MMT of all muscle groups tested in order to decrease pain and maximize ROM, gait, and balance    Time  4    Period  Weeks    Status  Achieved  PT Long Term Goals - 08/01/17 1435      PT LONG TERM GOAL #1   Title  Pt will have L hip AROM WFL without pain in order to maximize his ability to squat down and pick up items off the ground/floor.     Time  8    Period  Weeks    Status  Achieved      PT LONG TERM GOAL #2   Title  Pt will have 1 grade improvement in MMT of all muscle groups tested in order to decrease pain and allow pt to perform all ADLs and IADLs with greater ease.     Time  8    Period  Weeks    Status  Partially Met      PT LONG TERM GOAL #3   Title  Pt will be able to ambulate at least 878f during the 3MWT with LRAD, gait WFL, and no pain, in order to allow pt to return to strolling with his 3YO grandson.    Baseline  1/14: 7075fwith SPC, no pain just L hip tightness    Time  8    Period  Weeks    Status  On-going      PT LONG TERM GOAL #4   Title  Pt will be able to perform bil SLS for 10 sec with no UE in order to maximize pt's gait on uneven ground and allow pt to return to hiking/outdoor activities.     Baseline  1/14: R: 6 sec, L: 8 sec with compensations    Time  8    Period  Weeks    Status  On-going      PT LONG TERM GOAL #5   Title  Pt will be able to perform the TUG in 12 sec or < with LRAD in  order to demo improved balance and maximize his community access.    Baseline  1/14: 10.99sec with SPC    Time  8    Period  Weeks    Status  Partially Met      PT LONG TERM GOAL #6   Title  Pt will have improved 5xSTS to 12 sec or < to demo improved functional strength and overall balance.    Baseline  1/14: 11 sec, improper mechanics    Time  8    Period  Weeks    Status  Partially Met            Plan - 08/04/17 1541    Clinical Impression Statement  Continued with focus on improving Lt hip stabitliy and return to normal activities.  Added tandem stance on foam and progressed to SLS on foam with theraband actvitiy as he is now able to balance 30" on solid surface.  Also added balance beam with added challenge of hurdles.  Pt without LOB and is overall progressing well.   Continues to work on ambulating without AD at home.      Rehab Potential  Excellent    PT Frequency  2x / week    PT Duration  8 weeks    PT Treatment/Interventions  ADLs/Self Care Home Management;Cryotherapy;Electrical Stimulation;Moist Heat;DME Instruction;Gait training;Stair training;Functional mobility training;Therapeutic activities;Therapeutic exercise;Balance training;Neuromuscular re-education;Patient/family education;Manual techniques;Scar mobilization;Passive range of motion;Dry needling;Energy conservation;Taping;Vestibular    PT Next Visit Plan  Continue to focus on functional strengthening and balance training. Hip abd and ext strengthening.  next session progress to 12" hurdles over balance beam.    PT Home Exercise  Plan  eval: heel slides, supine HS stretch, standing lateral weight shifts; 1/7: SLS, SLS with vectors, bridging, s/l clams with GTB    Consulted and Agree with Plan of Care  Patient       Patient will benefit from skilled therapeutic intervention in order to improve the following deficits and impairments:  Abnormal gait, Decreased activity tolerance, Decreased balance, Decreased endurance,  Decreased mobility, Decreased range of motion, Decreased scar mobility, Decreased strength, Difficulty walking, Hypomobility, Increased edema, Increased fascial restricitons, Increased muscle spasms, Impaired flexibility, Improper body mechanics, Pain  Visit Diagnosis: Stiffness of left hip, not elsewhere classified  Pain in left hip  Muscle weakness (generalized)  Difficulty in walking, not elsewhere classified     Problem List Patient Active Problem List   Diagnosis Date Noted  . Closed intertrochanteric fracture of left femur with routine healing 06/19/17 surgery in [place] 08/02/2017   Teena Irani, PTA/CLT [phone]  Teena Irani 08/04/2017, 3:48 PM  [place] [address] [place], [place], [address] Phone: [phone]   Fax:  [phone]  Name: [name] MRN: [id number] Date of Birth: 1950-10-01

## 2017-08-09 ENCOUNTER — Encounter (HOSPITAL_COMMUNITY): Payer: Self-pay

## 2017-08-09 ENCOUNTER — Ambulatory Visit (HOSPITAL_COMMUNITY): Payer: Medicare PPO

## 2017-08-09 DIAGNOSIS — R262 Difficulty in walking, not elsewhere classified: Secondary | ICD-10-CM | POA: Diagnosis not present

## 2017-08-09 DIAGNOSIS — M25652 Stiffness of left hip, not elsewhere classified: Secondary | ICD-10-CM

## 2017-08-09 DIAGNOSIS — M6281 Muscle weakness (generalized): Secondary | ICD-10-CM

## 2017-08-09 DIAGNOSIS — M25552 Pain in left hip: Secondary | ICD-10-CM | POA: Diagnosis not present

## 2017-08-09 NOTE — Therapy (Signed)
[place], [place], [address] Phone: [phone]   Fax:  [phone]  Physical Therapy Treatment  Patient Details  Name: [name] MRN: [id number] Date of Birth: 02/17/1951 Referring Provider: Arther Abbott, MD   Encounter Date: 08/09/2017  PT End of Session - 08/09/17 1436    Visit Number  11    Number of Visits  17    Date for PT Re-Evaluation  08/31/17    Authorization Type  [employer] CHO    Authorization Time Period  07/06/17 to 08/31/17    PT Start Time  1434    PT Stop Time  1515    PT Time Calculation (min)  41 min    Activity Tolerance  Patient tolerated treatment well;No increased pain    Behavior During Therapy  [facility] for tasks assessed/performed       History reviewed. No pertinent past medical history.  History reviewed. No pertinent surgical history.  There were no vitals filed for this visit.  Subjective Assessment - 08/09/17 1436    Subjective  Pt states that he's feeling good today, he's jsut sore.    Currently in Pain?  No/denies           [facility] Adult PT Treatment/Exercise - 08/09/17 0001      Knee/Hip Exercises: Stretches   Active Hamstring Stretch  Left;3 reps;30 seconds    Active Hamstring Stretch Limitations  standing wtih LLE on 8" step    Gastroc Stretch  Both;3 reps;30 seconds    Gastroc Stretch Limitations  slant board      Knee/Hip Exercises: Machines for Strengthening   Total Gym Leg Press  3x10 at 34deg      Knee/Hip Exercises: Standing   Lateral Step Up  Left;2 sets;10 reps;Step Height: 6"    Lateral Step Up Limitations  holding bil 5# DB    Forward Step Up  Left;2 sets;10 reps;Step Height: 6"    Forward Step Up Limitations  holding bil 5# DB    Functional Squat  2 sets;15 reps    Functional Squat Limitations  with large blue band around waist for tactile cueing for proper weight shift    Wall Squat  2 sets;10 reps    SLS with Vectors  star drill on  foam 2 sets x5RT (light 1 UE assist); L SLS on firm + palov pres with GTB x15 reps    Gait Training  x2 laps around gym without SPC (cues for mechanics)    Other Standing Knee Exercises  hip hikes on 2" step 2x10; tandem stance on foam (LLE back) + palov press x15 reps with GTB      Balance Exercises - 08/09/17 1506      Balance Exercises: Standing   Step Over Hurdles / Cones  2RT 3 12" hurdles sideways and tandem on foam            PT Education - 08/09/17 1437    Education provided  Yes    Education Details  exercise technique, gait without SPC, updated HEP    Person(s) Educated  Patient    Methods  Explanation;Demonstration    Comprehension  Verbalized understanding;Returned demonstration       PT Short Term Goals - 08/01/17 1434      PT SHORT TERM GOAL #1   Title  Pt will be independent with HEP in order to maximize overall recovery and function at home.    Time  4  Period  Weeks    Status  Achieved      PT SHORT TERM GOAL #2   Title  Pt will have improved L hip AROM by 10 deg in all planes in order to decrease pain and maximize pt's ability to tolerate sitting and standing for longer periods of time.     Time  4    Period  Weeks    Status  Achieved      PT SHORT TERM GOAL #3   Title  Pt will have 1/2 grade improvement in MMT of all muscle groups tested in order to decrease pain and maximize ROM, gait, and balance    Time  4    Period  Weeks    Status  Achieved        PT Long Term Goals - 08/01/17 1435      PT LONG TERM GOAL #1   Title  Pt will have L hip AROM WFL without pain in order to maximize his ability to squat down and pick up items off the ground/floor.     Time  8    Period  Weeks    Status  Achieved      PT LONG TERM GOAL #2   Title  Pt will have 1 grade improvement in MMT of all muscle groups tested in order to decrease pain and allow pt to perform all ADLs and IADLs with greater ease.     Time  8    Period  Weeks    Status  Partially Met       PT LONG TERM GOAL #3   Title  Pt will be able to ambulate at least 850f during the 3MWT with LRAD, gait WFL, and no pain, in order to allow pt to return to strolling with his 3YO grandson.    Baseline  1/14: 7086fwith SPC, no pain just L hip tightness    Time  8    Period  Weeks    Status  On-going      PT LONG TERM GOAL #4   Title  Pt will be able to perform bil SLS for 10 sec with no UE in order to maximize pt's gait on uneven ground and allow pt to return to hiking/outdoor activities.     Baseline  1/14: R: 6 sec, L: 8 sec with compensations    Time  8    Period  Weeks    Status  On-going      PT LONG TERM GOAL #5   Title  Pt will be able to perform the TUG in 12 sec or < with LRAD in order to demo improved balance and maximize his community access.    Baseline  1/14: 10.99sec with SPC    Time  8    Period  Weeks    Status  Partially Met      PT LONG TERM GOAL #6   Title  Pt will have improved 5xSTS to 12 sec or < to demo improved functional strength and overall balance.    Baseline  1/14: 11 sec, improper mechanics    Time  8    Period  Weeks    Status  Partially Met            Plan - 08/09/17 1627    Clinical Impression Statement  Assessed pt's gait without SPC this date and he was noted to have min deviations, most notably L Trendelenberg and L lateral trunk lean, both  of which indicate L glute med weakness. Progressed pt's strengthening, dynamic stability, and balance work this date with good tolerance, pt only reporting muscle fatigue. He continues to shift away from his L side during functional squats so PT corrected by using the thick blue band and pulling pt to the R (into his deviation) in order to cue him to correct and shift his weight over the L more. Continue to progress strengthening and stability as tolerated.     Rehab Potential  Excellent    PT Frequency  2x / week    PT Duration  8 weeks    PT Treatment/Interventions  ADLs/Self Care Home  Management;Cryotherapy;Electrical Stimulation;Moist Heat;DME Instruction;Gait training;Stair training;Functional mobility training;Therapeutic activities;Therapeutic exercise;Balance training;Neuromuscular re-education;Patient/family education;Manual techniques;Scar mobilization;Passive range of motion;Dry needling;Energy conservation;Taping;Vestibular    PT Next Visit Plan  Continue to focus on functional strengthening and balance training. Hip abd and ext strengthening, especially abd; continue hip hikes, add lunging and add eccentric lateral step downs    PT Home Exercise Plan  eval: heel slides, supine HS stretch, standing lateral weight shifts; 1/7: SLS, SLS with vectors, bridging, s/l clams with GTB; 1/22: wall slides    Consulted and Agree with Plan of Care  Patient       Patient will benefit from skilled therapeutic intervention in order to improve the following deficits and impairments:  Abnormal gait, Decreased activity tolerance, Decreased balance, Decreased endurance, Decreased mobility, Decreased range of motion, Decreased scar mobility, Decreased strength, Difficulty walking, Hypomobility, Increased edema, Increased fascial restricitons, Increased muscle spasms, Impaired flexibility, Improper body mechanics, Pain  Visit Diagnosis: Stiffness of left hip, not elsewhere classified  Pain in left hip  Muscle weakness (generalized)  Difficulty in walking, not elsewhere classified     Problem List Patient Active Problem List   Diagnosis Date Noted  . Closed intertrochanteric fracture of left femur with routine healing 06/19/17 surgery in [place] 08/02/2017       Geraldine Solar PT, [place] [address] [place], [place], [address] Phone: [phone]   Fax:  [phone]  Name: [name] MRN: [id number] Date of Birth: 1950-10-04

## 2017-08-09 NOTE — Patient Instructions (Signed)
  WALL SQUATS  Leaning up against a wall or closed door on your back, slide your body downward and then return back to upright position.  A door was used here because it was smoother and had less friction than the wall.   Knees should bend in line with the 2nd toe and not pass the front of the foot.  Add in with other exercises, 2-3 sets of 10-15 reps

## 2017-08-11 ENCOUNTER — Encounter (HOSPITAL_COMMUNITY): Payer: Self-pay

## 2017-08-11 ENCOUNTER — Ambulatory Visit (HOSPITAL_COMMUNITY): Payer: Medicare PPO

## 2017-08-11 DIAGNOSIS — M6281 Muscle weakness (generalized): Secondary | ICD-10-CM

## 2017-08-11 DIAGNOSIS — M25652 Stiffness of left hip, not elsewhere classified: Secondary | ICD-10-CM | POA: Diagnosis not present

## 2017-08-11 DIAGNOSIS — R262 Difficulty in walking, not elsewhere classified: Secondary | ICD-10-CM

## 2017-08-11 DIAGNOSIS — M25552 Pain in left hip: Secondary | ICD-10-CM | POA: Diagnosis not present

## 2017-08-11 NOTE — Therapy (Signed)
[place], [place], [address] Phone: [phone]   Fax:  [phone]  Physical Therapy Treatment  Patient Details  Name: [name] MRN: [id number] Date of Birth: March 08, 1951 Referring Provider: Arther Abbott, MD   Encounter Date: 08/11/2017  PT End of Session - 08/11/17 1438    Visit Number  12    Number of Visits  17    Date for PT Re-Evaluation  08/31/17    Authorization Type  [employer]    Authorization Time Period  07/06/17 to 08/31/17    PT Start Time  1433    PT Stop Time  1515    PT Time Calculation (min)  42 min    Activity Tolerance  Patient tolerated treatment well;No increased pain    Behavior During Therapy  [facility] for tasks assessed/performed       History reviewed. No pertinent past medical history.  History reviewed. No pertinent surgical history.  There were no vitals filed for this visit.  Subjective Assessment - 08/11/17 1438    Subjective  No reports of pain today, just a little productive soreness.  Pt arrived with no AD, reports he's been wihtout since Tuesday    Patient Stated Goals  get back to where he was    Currently in Pain?  No/denies                      [facility] Adult PT Treatment/Exercise - 08/11/17 0001      Knee/Hip Exercises: Machines for Strengthening   Total Gym Leg Press  SLS 2x 10 at 25 deg      Knee/Hip Exercises: Standing   Forward Lunges  Both;15 reps    Forward Lunges Limitations  4" step with UE assist    Lateral Step Up  Both;15 reps;Step Height: 6";Hand Hold: 0    Lateral Step Up Limitations  holding bil 5# DB    Forward Step Up  Both;15 reps;Hand Hold: 1;Step Height: 6"    Forward Step Up Limitations  opposite LE knee drives    Step Down  Both;15 reps;Hand Hold: 1;Step Height: 4"    Functional Squat  15 reps    Functional Squat Limitations  with large blue band around waist for tactile cueing for proper weight shift    SLS with  Vectors  star drill on foam 2 sets x5RT (light 1 UE assist); L SLS on firm + palov pres with GTB x15 reps; 3x 5" vector stance BLE    Gait Training  x2 laps around gym without SPC (cues for mechanics)    Other Standing Knee Exercises  tandem stance on foam 3# bar flexion, palov press on foam     Other Standing Knee Exercises  sidestep in minisquat with BTB          Balance Exercises - 08/11/17 1453      Balance Exercises: Standing   Tandem Stance  Eyes open;Foam/compliant surface 15x Ue flexion while on balalnce beam    Step Over Hurdles / Cones  2RT 12" hurdles forward and sideways          PT Short Term Goals - 08/01/17 1434      PT SHORT TERM GOAL #1   Title  Pt will be independent with HEP in order to maximize overall recovery and function at home.    Time  4    Period  Weeks    Status  Achieved  PT SHORT TERM GOAL #2   Title  Pt will have improved L hip AROM by 10 deg in all planes in order to decrease pain and maximize pt's ability to tolerate sitting and standing for longer periods of time.     Time  4    Period  Weeks    Status  Achieved      PT SHORT TERM GOAL #3   Title  Pt will have 1/2 grade improvement in MMT of all muscle groups tested in order to decrease pain and maximize ROM, gait, and balance    Time  4    Period  Weeks    Status  Achieved        PT Long Term Goals - 08/01/17 1435      PT LONG TERM GOAL #1   Title  Pt will have L hip AROM WFL without pain in order to maximize his ability to squat down and pick up items off the ground/floor.     Time  8    Period  Weeks    Status  Achieved      PT LONG TERM GOAL #2   Title  Pt will have 1 grade improvement in MMT of all muscle groups tested in order to decrease pain and allow pt to perform all ADLs and IADLs with greater ease.     Time  8    Period  Weeks    Status  Partially Met      PT LONG TERM GOAL #3   Title  Pt will be able to ambulate at least 854f during the 3MWT with LRAD, gait  WFL, and no pain, in order to allow pt to return to strolling with his 3YO grandson.    Baseline  1/14: 705fwith SPC, no pain just L hip tightness    Time  8    Period  Weeks    Status  On-going      PT LONG TERM GOAL #4   Title  Pt will be able to perform bil SLS for 10 sec with no UE in order to maximize pt's gait on uneven ground and allow pt to return to hiking/outdoor activities.     Baseline  1/14: R: 6 sec, L: 8 sec with compensations    Time  8    Period  Weeks    Status  On-going      PT LONG TERM GOAL #5   Title  Pt will be able to perform the TUG in 12 sec or < with LRAD in order to demo improved balance and maximize his community access.    Baseline  1/14: 10.99sec with SPC    Time  8    Period  Weeks    Status  Partially Met      PT LONG TERM GOAL #6   Title  Pt will have improved 5xSTS to 12 sec or < to demo improved functional strength and overall balance.    Baseline  1/14: 11 sec, improper mechanics    Time  8    Period  Weeks    Status  Partially Met            Plan - 08/11/17 1520    Clinical Impression Statement  Session focus on functional strengthening.  No AD used during session.  Pt does continues to demonstrate Lt Trendeleberg and Lt lateral trunk lean indicating Lt glut med weakness.  Added sidestep with minisquats, SLS squats on power tower  and step down for gluteal and quad strengthening.  Noted instabiliity and visible musculature fatigue with new activities, min cueing to reduce compensation with tasks.  Pt does present with improved weight bearing with squats though does require visual and verbal cueing.      Rehab Potential  Excellent    PT Frequency  2x / week    PT Duration  8 weeks    PT Treatment/Interventions  ADLs/Self Care Home Management;Cryotherapy;Electrical Stimulation;Moist Heat;DME Instruction;Gait training;Stair training;Functional mobility training;Therapeutic activities;Therapeutic exercise;Balance training;Neuromuscular  re-education;Patient/family education;Manual techniques;Scar mobilization;Passive range of motion;Dry needling;Energy conservation;Taping;Vestibular    PT Next Visit Plan  Continue to focus on functional strengthening and balance training. Hip abd and ext strengthening, especially abd; continue hip hikes, begin lunges on BOSU and increase step down height as able.      PT Home Exercise Plan  eval: heel slides, supine HS stretch, standing lateral weight shifts; 1/7: SLS, SLS with vectors, bridging, s/l clams with GTB; 1/22: wall slides       Patient will benefit from skilled therapeutic intervention in order to improve the following deficits and impairments:  Abnormal gait, Decreased activity tolerance, Decreased balance, Decreased endurance, Decreased mobility, Decreased range of motion, Decreased scar mobility, Decreased strength, Difficulty walking, Hypomobility, Increased edema, Increased fascial restricitons, Increased muscle spasms, Impaired flexibility, Improper body mechanics, Pain  Visit Diagnosis: Stiffness of left hip, not elsewhere classified  Pain in left hip  Muscle weakness (generalized)  Difficulty in walking, not elsewhere classified     Problem List Patient Active Problem List   Diagnosis Date Noted  . Closed intertrochanteric fracture of left femur with routine healing 06/19/17 surgery in [place] 08/02/2017   Ihor Austin, [place]; [place]  Aldona Lento 08/11/2017, 3:27 PM  [place], [place], [address] Phone: [phone]   Fax:  [phone]  Name: [name] MRN: [id number] Date of Birth: Jun 29, 1951

## 2017-08-16 ENCOUNTER — Ambulatory Visit (HOSPITAL_COMMUNITY): Payer: Medicare PPO

## 2017-08-16 DIAGNOSIS — M25552 Pain in left hip: Secondary | ICD-10-CM | POA: Diagnosis not present

## 2017-08-16 DIAGNOSIS — M6281 Muscle weakness (generalized): Secondary | ICD-10-CM

## 2017-08-16 DIAGNOSIS — R262 Difficulty in walking, not elsewhere classified: Secondary | ICD-10-CM | POA: Diagnosis not present

## 2017-08-16 DIAGNOSIS — M25652 Stiffness of left hip, not elsewhere classified: Secondary | ICD-10-CM

## 2017-08-16 NOTE — Therapy (Signed)
[place], [place], [address] Phone: [phone]   Fax:  [phone]  Physical Therapy Treatment  Patient Details  Name: [name] MRN: [id number] Date of Birth: 04-13-1951 Referring Provider: Arther Abbott, MD   Encounter Date: 08/16/2017  PT End of Session - 08/16/17 1429    Visit Number  13    Number of Visits  17    Date for PT Re-Evaluation  08/31/17    Authorization Type  [employer] CHO    Authorization Time Period  07/06/17 to 08/31/17    PT Start Time  1430    Activity Tolerance  Patient tolerated treatment well;No increased pain    Behavior During Therapy  [facility] for tasks assessed/performed       No past medical history on file.  No past surgical history on file.  There were no vitals filed for this visit.  Subjective Assessment - 08/16/17 1432    Subjective  Pt states that he's doing good. He states that his soreness in his anterior thigh is pretty much gone, it's all in his hip now.     Patient Stated Goals  get back to where he was    Currently in Pain?  No/denies          [facility] Adult PT Treatment/Exercise - 08/16/17 0001      Knee/Hip Exercises: Stretches   Active Hamstring Stretch  Left;3 reps;30 seconds    Active Hamstring Stretch Limitations  standing wtih LLE on 8" step    Gastroc Stretch  Both;3 reps;30 seconds    Gastroc Stretch Limitations  slant board      Knee/Hip Exercises: Machines for Strengthening   Total Gym Leg Press  SLS 2x15 at 27deg; x15 5" eccentric lower at 27deg      Knee/Hip Exercises: Standing   Forward Lunges  Both;15 reps    Forward Lunges Limitations  on BOSU    Hip Abduction  Both;2 sets;10 reps    Abduction Limitations  45* posterolateral direction    Lateral Step Up  Left;10 reps;Step Height: 8"    Forward Step Up  Left;10 reps;Step Height: 8"    Forward Step Up Limitations  cues to decrease push-off with RLE    Step Down Limitations  hip  hikes on 4" step +4# ankle weight 2x15 reps    Functional Squat  15 reps    Functional Squat Limitations  on BOSU; 2x15 reps to 18" step  with large blue band around waist for tactile cueing during 1st set    Gait Training  x2 laps around gym without SPC (cues for mechanics)    Other Standing Knee Exercises  reverse ski lunge on 4" step 2x15 reps    Other Standing Knee Exercises  tandem gait on foam x3RT      Knee/Hip Exercises: Supine   Single Leg Bridge  Left;3 sets;5 reps      Knee/Hip Exercises: Prone   Other Prone Exercises  bil firehydrants x15 reps            PT Education - 08/16/17 1429    Education provided  Yes    Education Details  exercise technique,     Person(s) Educated  Patient    Methods  Explanation;Demonstration    Comprehension  Verbalized understanding;Returned demonstration       PT Short Term Goals - 08/01/17 1434      PT SHORT TERM GOAL #1   Title  Pt  will be independent with HEP in order to maximize overall recovery and function at home.    Time  4    Period  Weeks    Status  Achieved      PT SHORT TERM GOAL #2   Title  Pt will have improved L hip AROM by 10 deg in all planes in order to decrease pain and maximize pt's ability to tolerate sitting and standing for longer periods of time.     Time  4    Period  Weeks    Status  Achieved      PT SHORT TERM GOAL #3   Title  Pt will have 1/2 grade improvement in MMT of all muscle groups tested in order to decrease pain and maximize ROM, gait, and balance    Time  4    Period  Weeks    Status  Achieved        PT Long Term Goals - 08/01/17 1435      PT LONG TERM GOAL #1   Title  Pt will have L hip AROM WFL without pain in order to maximize his ability to squat down and pick up items off the ground/floor.     Time  8    Period  Weeks    Status  Achieved      PT LONG TERM GOAL #2   Title  Pt will have 1 grade improvement in MMT of all muscle groups tested in order to decrease pain and allow  pt to perform all ADLs and IADLs with greater ease.     Time  8    Period  Weeks    Status  Partially Met      PT LONG TERM GOAL #3   Title  Pt will be able to ambulate at least 834f during the 3MWT with LRAD, gait WFL, and no pain, in order to allow pt to return to strolling with his 3YO grandson.    Baseline  1/14: 7040fwith SPC, no pain just L hip tightness    Time  8    Period  Weeks    Status  On-going      PT LONG TERM GOAL #4   Title  Pt will be able to perform bil SLS for 10 sec with no UE in order to maximize pt's gait on uneven ground and allow pt to return to hiking/outdoor activities.     Baseline  1/14: R: 6 sec, L: 8 sec with compensations    Time  8    Period  Weeks    Status  On-going      PT LONG TERM GOAL #5   Title  Pt will be able to perform the TUG in 12 sec or < with LRAD in order to demo improved balance and maximize his community access.    Baseline  1/14: 10.99sec with SPC    Time  8    Period  Weeks    Status  Partially Met      PT LONG TERM GOAL #6   Title  Pt will have improved 5xSTS to 12 sec or < to demo improved functional strength and overall balance.    Baseline  1/14: 11 sec, improper mechanics    Time  8    Period  Weeks    Status  Partially Met            Plan - 08/16/17 1510    Clinical Impression Statement  Progressed  pt's BLE and functional strengthening and stability this date. No pain during session, just reports of muscle fatigue. Pt's squats down to 18" step improving as he was able to demo much improved weight shift during 2nd set without the need for tactile cueing. Also progressed pt to more single leg functional activities/strengthening. Pt improving very well overall. Progress as able in future sessions.    Rehab Potential  Excellent    PT Frequency  2x / week    PT Duration  8 weeks    PT Treatment/Interventions  ADLs/Self Care Home Management;Cryotherapy;Electrical Stimulation;Moist Heat;DME Instruction;Gait  training;Stair training;Functional mobility training;Therapeutic activities;Therapeutic exercise;Balance training;Neuromuscular re-education;Patient/family education;Manual techniques;Scar mobilization;Passive range of motion;Dry needling;Energy conservation;Taping;Vestibular    PT Next Visit Plan  Continue to focus on functional strengthening and balance training. Hip abd and ext strengthening, especially abd; continue begin lunges on BOSU and increase step down height as able. continue SL bridging    PT Home Exercise Plan  eval: heel slides, supine HS stretch, standing lateral weight shifts; 1/7: SLS, SLS with vectors, bridging, s/l clams with GTB; 1/22: wall slides    Consulted and Agree with Plan of Care  Patient       Patient will benefit from skilled therapeutic intervention in order to improve the following deficits and impairments:  Abnormal gait, Decreased activity tolerance, Decreased balance, Decreased endurance, Decreased mobility, Decreased range of motion, Decreased scar mobility, Decreased strength, Difficulty walking, Hypomobility, Increased edema, Increased fascial restricitons, Increased muscle spasms, Impaired flexibility, Improper body mechanics, Pain  Visit Diagnosis: Stiffness of left hip, not elsewhere classified  Pain in left hip  Muscle weakness (generalized)  Difficulty in walking, not elsewhere classified     Problem List Patient Active Problem List   Diagnosis Date Noted  . Closed intertrochanteric fracture of left femur with routine healing 06/19/17 surgery in [place] 08/02/2017      Geraldine Solar PT, [place] [address] [place], [place], [address] Phone: [phone]   Fax:  [phone]  Name: JAQUAVIS FELMLEE MRN: [id number] Date of Birth: 1950/11/05

## 2017-08-18 ENCOUNTER — Encounter (HOSPITAL_COMMUNITY): Payer: Self-pay

## 2017-08-18 ENCOUNTER — Ambulatory Visit (HOSPITAL_COMMUNITY): Payer: Medicare PPO

## 2017-08-18 DIAGNOSIS — R262 Difficulty in walking, not elsewhere classified: Secondary | ICD-10-CM

## 2017-08-18 DIAGNOSIS — M6281 Muscle weakness (generalized): Secondary | ICD-10-CM | POA: Diagnosis not present

## 2017-08-18 DIAGNOSIS — M25652 Stiffness of left hip, not elsewhere classified: Secondary | ICD-10-CM

## 2017-08-18 DIAGNOSIS — M25552 Pain in left hip: Secondary | ICD-10-CM | POA: Diagnosis not present

## 2017-08-18 NOTE — Therapy (Signed)
[place], [place], [address] Phone: [phone]   Fax:  [phone]  Physical Therapy Treatment  Patient Details  Name: [name] MRN: [id number] Date of Birth: 07-Aug-1950 Referring Provider: Arther Abbott, MD   Encounter Date: 08/18/2017  PT End of Session - 08/18/17 1434    Visit Number  14    Number of Visits  17    Date for PT Re-Evaluation  08/31/17    Authorization Type  [employer]    Authorization Time Period  07/06/17 to 08/31/17    PT Start Time  1426    PT Stop Time  1512    PT Time Calculation (min)  46 min    Activity Tolerance  Patient tolerated treatment well;No increased pain    Behavior During Therapy  [facility] for tasks assessed/performed       History reviewed. No pertinent past medical history.  History reviewed. No pertinent surgical history.  There were no vitals filed for this visit.  Subjective Assessment - 08/18/17 1434    Subjective  Pt states that he's still sore from last session.     Patient Stated Goals  get back to where he was    Currently in Pain?  No/denies            [facility] Adult PT Treatment/Exercise - 08/18/17 0001      Knee/Hip Exercises: Stretches   Active Hamstring Stretch  Left;2 reps;30 seconds    Active Hamstring Stretch Limitations  standing wtih LLE on 8" step    Gastroc Stretch  Both;2 reps;30 seconds    Gastroc Stretch Limitations  slant board      Knee/Hip Exercises: Aerobic   Stationary Bike  x4 mins L3 at beginning of session for w/u (unbilled)      Knee/Hip Exercises: Machines for Strengthening   Total Gym Leg Press  SLS 2x15 at 27deg; x15 5" eccentric lower at 27deg      Knee/Hip Exercises: Standing   Forward Lunges  Both;15 reps    Forward Lunges Limitations  on BOSU    Hip Abduction  Both;2 sets;10 reps    Abduction Limitations  BTB, 45* posterolateral direction    Lateral Step Up  Left;2 sets;10 reps;Step Height: 8"    Forward Step Up  Left;2 sets;10 reps;Step Height: 8"    Forward Step Up Limitations  cues to decrease push-off with RLE    Step Down  Left;15 reps;Step Height: 8"    Step Down Limitations  reverse eccentric step down    SLS  LLE on foam + palov press with GTB x20 on both sides    Gait Training  x2 laps around gym without SPC (cues for mechanics)    Other Standing Knee Exercises  reverse ski lunge on 4" step 2x15 reps           PT Short Term Goals - 08/01/17 1434      PT SHORT TERM GOAL #1   Title  Pt will be independent with HEP in order to maximize overall recovery and function at home.    Time  4    Period  Weeks    Status  Achieved      PT SHORT TERM GOAL #2   Title  Pt will have improved L hip AROM by 10 deg in all planes in order to decrease pain and maximize pt's ability to tolerate sitting and standing for longer periods of time.  Time  4    Period  Weeks    Status  Achieved      PT SHORT TERM GOAL #3   Title  Pt will have 1/2 grade improvement in MMT of all muscle groups tested in order to decrease pain and maximize ROM, gait, and balance    Time  4    Period  Weeks    Status  Achieved        PT Long Term Goals - 08/01/17 1435      PT LONG TERM GOAL #1   Title  Pt will have L hip AROM WFL without pain in order to maximize his ability to squat down and pick up items off the ground/floor.     Time  8    Period  Weeks    Status  Achieved      PT LONG TERM GOAL #2   Title  Pt will have 1 grade improvement in MMT of all muscle groups tested in order to decrease pain and allow pt to perform all ADLs and IADLs with greater ease.     Time  8    Period  Weeks    Status  Partially Met      PT LONG TERM GOAL #3   Title  Pt will be able to ambulate at least 82f during the 3MWT with LRAD, gait WFL, and no pain, in order to allow pt to return to strolling with his 3YO grandson.    Baseline  1/14: 7043fwith SPC, no pain just L hip tightness    Time  8    Period   Weeks    Status  On-going      PT LONG TERM GOAL #4   Title  Pt will be able to perform bil SLS for 10 sec with no UE in order to maximize pt's gait on uneven ground and allow pt to return to hiking/outdoor activities.     Baseline  1/14: R: 6 sec, L: 8 sec with compensations    Time  8    Period  Weeks    Status  On-going      PT LONG TERM GOAL #5   Title  Pt will be able to perform the TUG in 12 sec or < with LRAD in order to demo improved balance and maximize his community access.    Baseline  1/14: 10.99sec with SPC    Time  8    Period  Weeks    Status  Partially Met      PT LONG TERM GOAL #6   Title  Pt will have improved 5xSTS to 12 sec or < to demo improved functional strength and overall balance.    Baseline  1/14: 11 sec, improper mechanics    Time  8    Period  Weeks    Status  Partially Met            Plan - 08/18/17 1514    Clinical Impression Statement  Pt presenting to therapy reporting that he was sore from last session. Continued with established POC, but did not progress pt much this date due to increased soreness. Pt with muscle fatigue noted during session. He was also noted to have increased weakness of hip IR AEB difficulty performing SLS + palov press with the GTB anchored to pt's right side. Continue as planned, progressing as tolerated.    Rehab Potential  Excellent    PT Frequency  2x / week  PT Duration  8 weeks    PT Treatment/Interventions  ADLs/Self Care Home Management;Cryotherapy;Electrical Stimulation;Moist Heat;DME Instruction;Gait training;Stair training;Functional mobility training;Therapeutic activities;Therapeutic exercise;Balance training;Neuromuscular re-education;Patient/family education;Manual techniques;Scar mobilization;Passive range of motion;Dry needling;Energy conservation;Taping;Vestibular    PT Next Visit Plan  Continue to focus on functional strengthening and balance training. Hip abd and ext strengthening, especially abd;  continue begin lunges on BOSU and increase step down height as able. continue SL bridging    PT Home Exercise Plan  eval: heel slides, supine HS stretch, standing lateral weight shifts; 1/7: SLS, SLS with vectors, bridging, s/l clams with GTB; 1/22: wall slides; SLS + palov press with GTB    Consulted and Agree with Plan of Care  Patient       Patient will benefit from skilled therapeutic intervention in order to improve the following deficits and impairments:  Abnormal gait, Decreased activity tolerance, Decreased balance, Decreased endurance, Decreased mobility, Decreased range of motion, Decreased scar mobility, Decreased strength, Difficulty walking, Hypomobility, Increased edema, Increased fascial restricitons, Increased muscle spasms, Impaired flexibility, Improper body mechanics, Pain  Visit Diagnosis: Stiffness of left hip, not elsewhere classified  Pain in left hip  Muscle weakness (generalized)  Difficulty in walking, not elsewhere classified     Problem List Patient Active Problem List   Diagnosis Date Noted  . Closed intertrochanteric fracture of left femur with routine healing 06/19/17 surgery in [place] 08/02/2017      Geraldine Solar PT, [place] [address] [place], [place], [address] Phone: [phone]   Fax:  [phone]  Name: [name] MRN: [id number] Date of Birth: [birth year]

## 2017-08-23 ENCOUNTER — Encounter (HOSPITAL_COMMUNITY): Payer: Self-pay

## 2017-08-23 ENCOUNTER — Ambulatory Visit (HOSPITAL_COMMUNITY): Payer: Medicare PPO | Attending: Orthopedic Surgery

## 2017-08-23 DIAGNOSIS — M25552 Pain in left hip: Secondary | ICD-10-CM | POA: Diagnosis not present

## 2017-08-23 DIAGNOSIS — R262 Difficulty in walking, not elsewhere classified: Secondary | ICD-10-CM | POA: Insufficient documentation

## 2017-08-23 DIAGNOSIS — M25652 Stiffness of left hip, not elsewhere classified: Secondary | ICD-10-CM | POA: Diagnosis not present

## 2017-08-23 DIAGNOSIS — M6281 Muscle weakness (generalized): Secondary | ICD-10-CM

## 2017-08-23 NOTE — Patient Instructions (Addendum)
  SIDELYING REVERSE CLAMS - REVERSE CLAMSHELL  While lying on your side with your knees bent, raise your top foot towards the ceiling while keeping contact of your knees together. Then, lower back down to original position.   Do not let your pelvis roll forward during the lifting movement.    Perform at least every other day, 2-3 sets of 10-15 reps with or without the red band   SINGLE LEG BRIDGE  While lying on your back with your knees bent, extend one knee as shown.   Next, raise your buttocks off the floor/bed.    Try and maintain your pelvis level the entire time.

## 2017-08-23 NOTE — Therapy (Signed)
[place], [place], [address] Phone: [phone]   Fax:  [phone]  Physical Therapy Treatment  Patient Details  Name: [name] MRN: [id number] Date of Birth: 05-Jan-1951 Referring Provider: Arther Abbott, MD   Encounter Date: 08/23/2017  PT End of Session - 08/23/17 1436    Visit Number  15    Number of Visits  17    Date for PT Re-Evaluation  08/31/17    Authorization Type  [employer]    Authorization Time Period  07/06/17 to 08/31/17    PT Start Time  1430 5 mins on bike unbilled    PT Stop Time  1515    PT Time Calculation (min)  45 min    Activity Tolerance  Patient tolerated treatment well;No increased pain    Behavior During Therapy  [facility] for tasks assessed/performed       History reviewed. No pertinent past medical history.  History reviewed. No pertinent surgical history.  There were no vitals filed for this visit.  Subjective Assessment - 08/23/17 1437    Subjective  Pt states that he is sore today as he has been on his lawnmower the last 2 days getting up leaves.     Patient Stated Goals  get back to where he was    Currently in Pain?  No/denies           [facility] Adult PT Treatment/Exercise - 08/23/17 0001      Knee/Hip Exercises: Aerobic   Stationary Bike  x5 mins L3 at beginning of session for w/u (unbilled)      Knee/Hip Exercises: Standing   Forward Lunges  Both;15 reps    Forward Lunges Limitations  on BOSU    Side Lunges  Both;15 reps    Side Lunges Limitations  on BOSU    Hip ADduction Limitations  standing hip IR against RTB x30 reps; standing with right foot forward on dyna disc and resisting ER moment/perturbations with purple band 2x 1 min bouts    Lateral Step Up  Left;15 reps;Hand Hold: 0;Step Height: 6"    Lateral Step Up Limitations  holding bil 5# DB    Forward Step Up  Left;15 reps;Hand Hold: 0;Step Height: 6"    Forward Step Up Limitations  bil 5# DB     Step Down  Left;15 reps;Hand Hold: 0;Step Height: 6"    Step Down Limitations  reverse eccentric step down + 8# DB in RUE    Other Standing Knee Exercises  bil SL squat + hip abd on contralateral limb into green physioball against wall 2x10 reps each      Knee/Hip Exercises: Supine   Bridges  Both;2 sets;10 reps    Bridges Limitations  on physioball, hips/knees 90/90    Single Leg Bridge  Left;15 reps    Other Supine Knee/Hip Exercises  hip ups with shoulders on table x10 reps, LLE only      Knee/Hip Exercises: Sidelying   Clams  L reverse clams 2x15           PT Short Term Goals - 08/01/17 1434      PT SHORT TERM GOAL #1   Title  Pt will be independent with HEP in order to maximize overall recovery and function at home.    Time  4    Period  Weeks    Status  Achieved      PT SHORT TERM GOAL #2   Title  Pt will have improved L hip AROM by 10 deg in all planes in order to decrease pain and maximize pt's ability to tolerate sitting and standing for longer periods of time.     Time  4    Period  Weeks    Status  Achieved      PT SHORT TERM GOAL #3   Title  Pt will have 1/2 grade improvement in MMT of all muscle groups tested in order to decrease pain and maximize ROM, gait, and balance    Time  4    Period  Weeks    Status  Achieved        PT Long Term Goals - 08/01/17 1435      PT LONG TERM GOAL #1   Title  Pt will have L hip AROM WFL without pain in order to maximize his ability to squat down and pick up items off the ground/floor.     Time  8    Period  Weeks    Status  Achieved      PT LONG TERM GOAL #2   Title  Pt will have 1 grade improvement in MMT of all muscle groups tested in order to decrease pain and allow pt to perform all ADLs and IADLs with greater ease.     Time  8    Period  Weeks    Status  Partially Met      PT LONG TERM GOAL #3   Title  Pt will be able to ambulate at least 824f during the 3MWT with LRAD, gait WFL, and no pain, in order to  allow pt to return to strolling with his 3YO grandson.    Baseline  1/14: 7038fwith SPC, no pain just L hip tightness    Time  8    Period  Weeks    Status  On-going      PT LONG TERM GOAL #4   Title  Pt will be able to perform bil SLS for 10 sec with no UE in order to maximize pt's gait on uneven ground and allow pt to return to hiking/outdoor activities.     Baseline  1/14: R: 6 sec, L: 8 sec with compensations    Time  8    Period  Weeks    Status  On-going      PT LONG TERM GOAL #5   Title  Pt will be able to perform the TUG in 12 sec or < with LRAD in order to demo improved balance and maximize his community access.    Baseline  1/14: 10.99sec with SPC    Time  8    Period  Weeks    Status  Partially Met      PT LONG TERM GOAL #6   Title  Pt will have improved 5xSTS to 12 sec or < to demo improved functional strength and overall balance.    Baseline  1/14: 11 sec, improper mechanics    Time  8    Period  Weeks    Status  Partially Met            Plan - 08/23/17 1518    Clinical Impression Statement  Continued with established POC focusing on functional and gluteal strength. Hip IR strength still deficient AEB increased ER with SL activities; introduced pt to reverse clams and other hip IR strengthening exercises this date. Pt demo'ing fatigue. Updated pt's HEP today. Continue as planned until pt's reassessment next  week.    Rehab Potential  Excellent    PT Frequency  2x / week    PT Duration  8 weeks    PT Treatment/Interventions  ADLs/Self Care Home Management;Cryotherapy;Electrical Stimulation;Moist Heat;DME Instruction;Gait training;Stair training;Functional mobility training;Therapeutic activities;Therapeutic exercise;Balance training;Neuromuscular re-education;Patient/family education;Manual techniques;Scar mobilization;Passive range of motion;Dry needling;Energy conservation;Taping;Vestibular    PT Next Visit Plan  Continue to focus on functional strengthening and  balance training. Hip abd and ext strengthening, especially abd; continue begin lunges on BOSU; continue hip IR strengthening    PT Home Exercise Plan  1/7: SLS, SLS with vectors, bridging, s/l clams with GTB; 1/22: wall slides; SLS + palov press with GTB; 2/8: single leg bridge, reverse clams    Consulted and Agree with Plan of Care  Patient       Patient will benefit from skilled therapeutic intervention in order to improve the following deficits and impairments:  Abnormal gait, Decreased activity tolerance, Decreased balance, Decreased endurance, Decreased mobility, Decreased range of motion, Decreased scar mobility, Decreased strength, Difficulty walking, Hypomobility, Increased edema, Increased fascial restricitons, Increased muscle spasms, Impaired flexibility, Improper body mechanics, Pain  Visit Diagnosis: Stiffness of left hip, not elsewhere classified  Pain in left hip  Muscle weakness (generalized)  Difficulty in walking, not elsewhere classified     Problem List Patient Active Problem List   Diagnosis Date Noted  . Closed intertrochanteric fracture of left femur with routine healing 06/19/17 surgery in [place] 08/02/2017       Geraldine Solar PT, [place] [address] [place], [place], [address] Phone: [phone]   Fax:  [phone]  Name: [name] MRN: [id number] Date of Birth: Mar 28, 1951

## 2017-08-25 ENCOUNTER — Ambulatory Visit (HOSPITAL_COMMUNITY): Payer: Medicare PPO | Admitting: Physical Therapy

## 2017-08-25 DIAGNOSIS — R262 Difficulty in walking, not elsewhere classified: Secondary | ICD-10-CM | POA: Diagnosis not present

## 2017-08-25 DIAGNOSIS — M25552 Pain in left hip: Secondary | ICD-10-CM | POA: Diagnosis not present

## 2017-08-25 DIAGNOSIS — M6281 Muscle weakness (generalized): Secondary | ICD-10-CM

## 2017-08-25 DIAGNOSIS — M25652 Stiffness of left hip, not elsewhere classified: Secondary | ICD-10-CM

## 2017-08-25 NOTE — Therapy (Signed)
[place], [place], [address] Phone: [phone]   Fax:  [phone]  Physical Therapy Treatment  Patient Details  Name: [name] MRN: [id number] Date of Birth: [birth year] Referring Provider: Arther Abbott, MD   Encounter Date: 08/25/2017  PT End of Session - 08/25/17 1607    Visit Number  16    Number of Visits  17    Date for PT Re-Evaluation  08/31/17    Authorization Type  [employer] CHO    Authorization Time Period  07/06/17 to 08/31/17    PT Start Time  1520    PT Stop Time  1603    PT Time Calculation (min)  43 min    Activity Tolerance  Patient tolerated treatment well;No increased pain    Behavior During Therapy  [facility] for tasks assessed/performed       No past medical history on file.  No past surgical history on file.  There were no vitals filed for this visit.  Subjective Assessment - 08/25/17 1535    Subjective  pt states he is sore but not really hurting.    Currently in Pain?  No/denies                      [facility] Adult PT Treatment/Exercise - 08/25/17 0001      Knee/Hip Exercises: Aerobic   Stationary Bike  x5 mins L3 at beginning of session for w/u (unbilled)      Knee/Hip Exercises: Standing   Forward Lunges  Both;15 reps    Forward Lunges Limitations  on BOSU    Side Lunges  Both;15 reps    Side Lunges Limitations  on BOSU    Lateral Step Up  Left;15 reps;Hand Hold: 0;Step Height: 6"    Lateral Step Up Limitations  holding bil 5# DB    Forward Step Up  Left;15 reps;Hand Hold: 0;Step Height: 6"    Forward Step Up Limitations  bil 5# DB    Step Down  Left;15 reps;Hand Hold: 0;Step Height: 6"    Step Down Limitations  reverse eccentric step down + 8# DB in RUE    SLS  with palov press bil RTB 10 reps each LE each direction (40 reps total)    Other Standing Knee Exercises  tandem balance on (2) dyna disc 30" X 2 each LE lead      Knee/Hip Exercises: Supine    Bridges  Both;2 sets;15 reps    Bridges Limitations  on physioball, hips/knees 90/90    Single Leg Bridge  Left;2 sets;10 reps               PT Short Term Goals - 08/01/17 1434      PT SHORT TERM GOAL #1   Title  Pt will be independent with HEP in order to maximize overall recovery and function at home.    Time  4    Period  Weeks    Status  Achieved      PT SHORT TERM GOAL #2   Title  Pt will have improved L hip AROM by 10 deg in all planes in order to decrease pain and maximize pt's ability to tolerate sitting and standing for longer periods of time.     Time  4    Period  Weeks    Status  Achieved      PT SHORT TERM GOAL #3   Title  Pt will have  1/2 grade improvement in MMT of all muscle groups tested in order to decrease pain and maximize ROM, gait, and balance    Time  4    Period  Weeks    Status  Achieved        PT Long Term Goals - 08/01/17 1435      PT LONG TERM GOAL #1   Title  Pt will have L hip AROM WFL without pain in order to maximize his ability to squat down and pick up items off the ground/floor.     Time  8    Period  Weeks    Status  Achieved      PT LONG TERM GOAL #2   Title  Pt will have 1 grade improvement in MMT of all muscle groups tested in order to decrease pain and allow pt to perform all ADLs and IADLs with greater ease.     Time  8    Period  Weeks    Status  Partially Met      PT LONG TERM GOAL #3   Title  Pt will be able to ambulate at least 854f during the 3MWT with LRAD, gait WFL, and no pain, in order to allow pt to return to strolling with his 3YO grandson.    Baseline  1/14: 7017fwith SPC, no pain just L hip tightness    Time  8    Period  Weeks    Status  On-going      PT LONG TERM GOAL #4   Title  Pt will be able to perform bil SLS for 10 sec with no UE in order to maximize pt's gait on uneven ground and allow pt to return to hiking/outdoor activities.     Baseline  1/14: R: 6 sec, L: 8 sec with compensations     Time  8    Period  Weeks    Status  On-going      PT LONG TERM GOAL #5   Title  Pt will be able to perform the TUG in 12 sec or < with LRAD in order to demo improved balance and maximize his community access.    Baseline  1/14: 10.99sec with SPC    Time  8    Period  Weeks    Status  Partially Met      PT LONG TERM GOAL #6   Title  Pt will have improved 5xSTS to 12 sec or < to demo improved functional strength and overall balance.    Baseline  1/14: 11 sec, improper mechanics    Time  8    Period  Weeks    Status  Partially Met            Plan - 08/25/17 1607    Clinical Impression Statement  contintued focus on improving gluteal strength and stabiltiy.  Increased challenge of tandem using dyna disc and decreased to single leg stance with UE task.  Pt with noted fatigue at end of session.  Increased sets/and/or reps where able.      Rehab Potential  Excellent    PT Frequency  2x / week    PT Duration  8 weeks    PT Treatment/Interventions  ADLs/Self Care Home Management;Cryotherapy;Electrical Stimulation;Moist Heat;DME Instruction;Gait training;Stair training;Functional mobility training;Therapeutic activities;Therapeutic exercise;Balance training;Neuromuscular re-education;Patient/family education;Manual techniques;Scar mobilization;Passive range of motion;Dry needling;Energy conservation;Taping;Vestibular    PT Next Visit Plan  Continue to focus on functional strengthening and balance training. Hip abd and ext  strengthening, especially abd.    PT Home Exercise Plan  1/7: SLS, SLS with vectors, bridging, s/l clams with GTB; 1/22: wall slides; SLS + palov press with GTB; 2/8: single leg bridge, reverse clams    Consulted and Agree with Plan of Care  Patient       Patient will benefit from skilled therapeutic intervention in order to improve the following deficits and impairments:  Abnormal gait, Decreased activity tolerance, Decreased balance, Decreased endurance, Decreased  mobility, Decreased range of motion, Decreased scar mobility, Decreased strength, Difficulty walking, Hypomobility, Increased edema, Increased fascial restricitons, Increased muscle spasms, Impaired flexibility, Improper body mechanics, Pain  Visit Diagnosis: Stiffness of left hip, not elsewhere classified  Pain in left hip  Muscle weakness (generalized)     Problem List Patient Active Problem List   Diagnosis Date Noted  . Closed intertrochanteric fracture of left femur with routine healing 06/19/17 surgery in [place] 08/02/2017   Teena Irani, PTA/CLT [phone]  Teena Irani 08/25/2017, 4:10 PM  [place] [address] [place], [place], [address] Phone: [phone]   Fax:  [phone]  Name: [name] MRN: [id number] Date of Birth: 07/22/1950

## 2017-08-30 ENCOUNTER — Ambulatory Visit (HOSPITAL_COMMUNITY): Payer: Medicare PPO | Admitting: Physical Therapy

## 2017-08-30 DIAGNOSIS — M25652 Stiffness of left hip, not elsewhere classified: Secondary | ICD-10-CM

## 2017-08-30 DIAGNOSIS — R262 Difficulty in walking, not elsewhere classified: Secondary | ICD-10-CM

## 2017-08-30 DIAGNOSIS — M25552 Pain in left hip: Secondary | ICD-10-CM

## 2017-08-30 DIAGNOSIS — M6281 Muscle weakness (generalized): Secondary | ICD-10-CM | POA: Diagnosis not present

## 2017-08-30 NOTE — Therapy (Signed)
[place], [place], [address] Phone: [phone]   Fax:  [phone]    I personally spoke with Roseanne Reno, PTA/CLT prior to and during this patient's session. I agree with its findings and concur that the patient is ready for discharge at this time.  Geraldine Solar PT, DPT   PHYSICAL THERAPY DISCHARGE SUMMARY  Visits from Start of Care: 17  Current functional level related to goals / functional outcomes: See below  Remaining deficits: See below   Education / Equipment: See below Plan: Patient agrees to discharge.  Patient goals were met. Patient is being discharged due to meeting the stated rehab goals.  ?????       Physical Therapy Treatment  Patient Details  Name: [name] MRN: [id number] Date of Birth: 12-Feb-1951 Referring Provider: Arther Abbott, MD   Encounter Date: 08/30/2017  PT End of Session - 08/30/17 1618    Visit Number  17    Number of Visits  17    Date for PT Re-Evaluation  08/31/17    Authorization Type  [employer] CHO    Authorization Time Period  07/06/17 to 08/31/17    PT Start Time  1520    PT Stop Time  1600    PT Time Calculation (min)  40 min    Activity Tolerance  Patient tolerated treatment well;No increased pain    Behavior During Therapy  [facility] for tasks assessed/performed       No past medical history on file.  No past surgical history on file.  There were no vitals filed for this visit.  Subjective Assessment - 08/30/17 1622    Subjective  Pt states he is doing well.  States he is walking commmunity distances and on uneven surfaces/hiking without difficulty.  Reports sleeping on his Rt side is also getting easier.  Pt verbalizes he is ready to continue strengtheing on his own at this point.     Currently in Pain?  No/denies         [facility] PT Assessment - 08/30/17 0001      AROM   Left Hip Extension  15 was 7 degrees 1/14 and -11 12/19    Left  Hip Flexion  115 was 112 degrees 1/14 and 96 degrees 12/19    Left Hip External Rotation   40 was 29 degrees 1/14 and 16 degrees 12/19    Left Hip Internal Rotation   30 was 20 degrees 1/14 and 3 degrees on 12/19    Left Hip ABduction  35 was 35 degrees on 12/20 and Rt is 40 degrees      Strength   Right Hip Extension  5/5 was 4+/5    Left Hip Flexion  5/5 was 5/5    Left Hip Extension  4+/5 was 4-/5    Left Hip External Rotation  4+/5 was 4+/5    Left Hip Internal Rotation  4/5 was 4/5    Left Hip ABduction  4+/5 in available ROM (Rt hip is 40 degrees), was 3+/5    Left Knee Flexion  5/5    Left Knee Extension  5/5    Left Ankle Dorsiflexion  5/5      Ambulation/Gait   Assistive device  None    Gait Pattern  Step-through pattern;Decreased stance time - left;Trendelenburg;Antalgic      Static Standing Balance   Static Standing - Balance Support  No upper extremity supported;Right upper extremity supported  Static Standing Balance -  Activities   Single Leg Stance - Right Leg;Single Leg Stance - Left Leg    Static Standing - Comment/# of Minutes  Rt: 30", Lt: 16"      Standardized Balance Assessment   Standardized Balance Assessment  Five Times Sit to Stand;Timed Up and Go Test    Five times sit to stand comments   6.17, no UE was 11 sec 1/14 and 20.6 sec 12/19      Timed Up and Go Test   TUG  Normal TUG    Normal TUG (seconds)  6.79 was 10.99 on 1/14 and 20.04 on 12/19                  [facility] Adult PT Treatment/Exercise - 08/30/17 0001      Ambulation/Gait   Ambulation Distance (Feet)  884 Feet was 700 feet with [facility] 1/14 and 304' with RW on 12/19               PT Short Term Goals - 08/30/17 1554      PT SHORT TERM GOAL #1   Title  Pt will be independent with HEP in order to maximize overall recovery and function at home.    Time  4    Period  Weeks    Status  Achieved      PT SHORT TERM GOAL #2   Title  Pt will have improved L hip AROM by 10 deg  in all planes in order to decrease pain and maximize pt's ability to tolerate sitting and standing for longer periods of time.     Time  4    Period  Weeks    Status  Achieved      PT SHORT TERM GOAL #3   Title  Pt will have 1/2 grade improvement in MMT of all muscle groups tested in order to decrease pain and maximize ROM, gait, and balance    Time  4    Period  Weeks    Status  Achieved        PT Long Term Goals - 08/30/17 1554      PT LONG TERM GOAL #1   Title  Pt will have L hip AROM WFL without pain in order to maximize his ability to squat down and pick up items off the ground/floor.     Time  8    Period  Weeks    Status  Achieved      PT LONG TERM GOAL #2   Title  Pt will have 1 grade improvement in MMT of all muscle groups tested in order to decrease pain and allow pt to perform all ADLs and IADLs with greater ease.     Time  8    Period  Weeks    Status  Partially Met      PT LONG TERM GOAL #3   Title  Pt will be able to ambulate at least 863f during the 3MWT with LRAD, gait WFL, and no pain, in order to allow pt to return to strolling with his 3YO grandson.    Baseline  1/14: 7069fwith SPC, no pain just L hip tightness    Time  8    Period  Weeks    Status  Achieved      PT LONG TERM GOAL #4   Title  Pt will be able to perform bil SLS for 10 sec with no UE in order to maximize pt's gait  on uneven ground and allow pt to return to hiking/outdoor activities.     Baseline  1/14: R: 6 sec, L: 8 sec with compensations    Time  8    Period  Weeks    Status  Achieved      PT LONG TERM GOAL #5   Title  Pt will be able to perform the TUG in 12 sec or < with LRAD in order to demo improved balance and maximize his community access.    Baseline  1/14: 10.99sec with SPC    Time  8    Period  Weeks    Status  Achieved      PT LONG TERM GOAL #6   Title  Pt will have improved 5xSTS to 12 sec or < to demo improved functional strength and overall balance.    Baseline   1/14: 11 sec, improper mechanics    Time  8    Period  Weeks    Status  Achieved            Plan - 08/30/17 1618    Clinical Impression Statement  Completed test measurements this session with overall progress and completion of goals obtained.  Pt now has return of functional strength, balance to complete community ambualtion without AD and no pain with actvities.  Pt continues to report some minor fatigue and residual soreness if completes too much activity, however is working to improve this.  Pt is comfortable at this time to continue independently with plans to join [employer] program at the [employer].      Rehab Potential  Excellent    PT Frequency  2x / week    PT Duration  8 weeks    PT Treatment/Interventions  ADLs/Self Care Home Management;Cryotherapy;Electrical Stimulation;Moist Heat;DME Instruction;Gait training;Stair training;Functional mobility training;Therapeutic activities;Therapeutic exercise;Balance training;Neuromuscular re-education;Patient/family education;Manual techniques;Scar mobilization;Passive range of motion;Dry needling;Energy conservation;Taping;Vestibular    PT Next Visit Plan  discharge to HEP per goals met and patient agreement.      PT Home Exercise Plan  1/7: SLS, SLS with vectors, bridging, s/l clams with GTB; 1/22: wall slides; SLS + palov press with GTB; 2/8: single leg bridge, reverse clams    Consulted and Agree with Plan of Care  Patient       Patient will benefit from skilled therapeutic intervention in order to improve the following deficits and impairments:  Abnormal gait, Decreased activity tolerance, Decreased balance, Decreased endurance, Decreased mobility, Decreased range of motion, Decreased scar mobility, Decreased strength, Difficulty walking, Hypomobility, Increased edema, Increased fascial restricitons, Increased muscle spasms, Impaired flexibility, Improper body mechanics, Pain  Visit Diagnosis: Stiffness of left hip, not  elsewhere classified  Pain in left hip  Muscle weakness (generalized)  Difficulty in walking, not elsewhere classified     Problem List Patient Active Problem List   Diagnosis Date Noted  . Closed intertrochanteric fracture of left femur with routine healing 06/19/17 surgery in [place] 08/02/2017   Teena Irani, PTA/CLT [phone]  Teena Irani 08/30/2017, 4:25 PM  [facility] [address] [place], [place], [address] Phone: [phone]   Fax:  [phone]  Name: [name] MRN: [id number] Date of Birth: 1950-12-13

## 2017-09-14 ENCOUNTER — Ambulatory Visit (INDEPENDENT_AMBULATORY_CARE_PROVIDER_SITE_OTHER): Payer: Medicare PPO | Admitting: Orthopedic Surgery

## 2017-09-14 ENCOUNTER — Encounter: Payer: Self-pay | Admitting: Orthopedic Surgery

## 2017-09-14 ENCOUNTER — Ambulatory Visit (INDEPENDENT_AMBULATORY_CARE_PROVIDER_SITE_OTHER): Payer: Medicare PPO

## 2017-09-14 VITALS — BP 175/108 | HR 88 | Ht 70.0 in | Wt 175.0 lb

## 2017-09-14 DIAGNOSIS — S72145D Nondisplaced intertrochanteric fracture of left femur, subsequent encounter for closed fracture with routine healing: Secondary | ICD-10-CM | POA: Diagnosis not present

## 2017-09-14 NOTE — Progress Notes (Signed)
POST OP VISIT   Patient ID: Troy GambleJerry W Rivera, male   DOB: 04/09/1951, 67 y.o.   MRN: [id number]  Chief Complaint  Patient presents with  . Post-op Follow-up    06/19/17     Encounter Diagnosis  Name Primary?  . Closed nondisplaced intertrochanteric fracture of left femur with routine healing, subsequent [place]12/2/18 [place]   Postoperative day #85 patient had a dynamic hip screw placed for inotrope fracture of the left hip.  Surgery was done in [place] he was transferred here because of his place of residence  He has no complaints his x-ray shows a fracture healed he has no limp his range of motion is normal he was released to normal activity

## 2017-10-28 DIAGNOSIS — R944 Abnormal results of kidney function studies: Secondary | ICD-10-CM | POA: Diagnosis not present

## 2017-10-28 DIAGNOSIS — H8149 Vertigo of central origin, unspecified ear: Secondary | ICD-10-CM | POA: Diagnosis not present

## 2017-10-28 DIAGNOSIS — Z87442 Personal history of urinary calculi: Secondary | ICD-10-CM | POA: Diagnosis not present

## 2017-10-28 DIAGNOSIS — E119 Type 2 diabetes mellitus without complications: Secondary | ICD-10-CM | POA: Diagnosis not present

## 2017-10-28 DIAGNOSIS — R972 Elevated prostate specific antigen [PSA]: Secondary | ICD-10-CM | POA: Diagnosis not present

## 2017-10-28 DIAGNOSIS — N200 Calculus of kidney: Secondary | ICD-10-CM | POA: Diagnosis not present

## 2017-10-28 DIAGNOSIS — N401 Enlarged prostate with lower urinary tract symptoms: Secondary | ICD-10-CM | POA: Diagnosis not present

## 2017-10-28 DIAGNOSIS — Z712 Person consulting for explanation of examination or test findings: Secondary | ICD-10-CM | POA: Diagnosis not present

## 2017-10-28 DIAGNOSIS — S72143A Displaced intertrochanteric fracture of unspecified femur, initial encounter for closed fracture: Secondary | ICD-10-CM | POA: Diagnosis not present

## 2017-11-01 DIAGNOSIS — Z6825 Body mass index (BMI) 25.0-25.9, adult: Secondary | ICD-10-CM | POA: Diagnosis not present

## 2017-11-01 DIAGNOSIS — E782 Mixed hyperlipidemia: Secondary | ICD-10-CM | POA: Diagnosis not present

## 2017-11-01 DIAGNOSIS — R972 Elevated prostate specific antigen [PSA]: Secondary | ICD-10-CM | POA: Diagnosis not present

## 2017-11-01 DIAGNOSIS — R944 Abnormal results of kidney function studies: Secondary | ICD-10-CM | POA: Diagnosis not present

## 2017-11-01 DIAGNOSIS — N200 Calculus of kidney: Secondary | ICD-10-CM | POA: Diagnosis not present

## 2017-11-01 DIAGNOSIS — E119 Type 2 diabetes mellitus without complications: Secondary | ICD-10-CM | POA: Diagnosis not present

## 2017-11-01 DIAGNOSIS — Z8781 Personal history of (healed) traumatic fracture: Secondary | ICD-10-CM | POA: Diagnosis not present

## 2017-11-01 DIAGNOSIS — F5221 Male erectile disorder: Secondary | ICD-10-CM | POA: Diagnosis not present

## 2017-11-01 DIAGNOSIS — I10 Essential (primary) hypertension: Secondary | ICD-10-CM | POA: Diagnosis not present

## 2017-12-07 DIAGNOSIS — I10 Essential (primary) hypertension: Secondary | ICD-10-CM | POA: Diagnosis not present

## 2017-12-07 DIAGNOSIS — M19041 Primary osteoarthritis, right hand: Secondary | ICD-10-CM | POA: Diagnosis not present

## 2017-12-07 DIAGNOSIS — J301 Allergic rhinitis due to pollen: Secondary | ICD-10-CM | POA: Diagnosis not present

## 2017-12-07 DIAGNOSIS — N400 Enlarged prostate without lower urinary tract symptoms: Secondary | ICD-10-CM | POA: Diagnosis not present

## 2017-12-07 DIAGNOSIS — M1612 Unilateral primary osteoarthritis, left hip: Secondary | ICD-10-CM | POA: Diagnosis not present

## 2017-12-07 DIAGNOSIS — E119 Type 2 diabetes mellitus without complications: Secondary | ICD-10-CM | POA: Diagnosis not present

## 2017-12-07 DIAGNOSIS — H9193 Unspecified hearing loss, bilateral: Secondary | ICD-10-CM | POA: Diagnosis not present

## 2017-12-07 DIAGNOSIS — Z6824 Body mass index (BMI) 24.0-24.9, adult: Secondary | ICD-10-CM | POA: Diagnosis not present

## 2017-12-07 DIAGNOSIS — E785 Hyperlipidemia, unspecified: Secondary | ICD-10-CM | POA: Diagnosis not present

## 2018-03-03 DIAGNOSIS — R972 Elevated prostate specific antigen [PSA]: Secondary | ICD-10-CM | POA: Diagnosis not present

## 2018-03-03 DIAGNOSIS — E119 Type 2 diabetes mellitus without complications: Secondary | ICD-10-CM | POA: Diagnosis not present

## 2018-03-03 DIAGNOSIS — F5221 Male erectile disorder: Secondary | ICD-10-CM | POA: Diagnosis not present

## 2018-03-03 DIAGNOSIS — I10 Essential (primary) hypertension: Secondary | ICD-10-CM | POA: Diagnosis not present

## 2018-03-03 DIAGNOSIS — E782 Mixed hyperlipidemia: Secondary | ICD-10-CM | POA: Diagnosis not present

## 2018-03-08 DIAGNOSIS — M1990 Unspecified osteoarthritis, unspecified site: Secondary | ICD-10-CM | POA: Diagnosis not present

## 2018-03-08 DIAGNOSIS — R5383 Other fatigue: Secondary | ICD-10-CM | POA: Diagnosis not present

## 2018-03-08 DIAGNOSIS — I10 Essential (primary) hypertension: Secondary | ICD-10-CM | POA: Diagnosis not present

## 2018-03-08 DIAGNOSIS — R944 Abnormal results of kidney function studies: Secondary | ICD-10-CM | POA: Diagnosis not present

## 2018-03-08 DIAGNOSIS — N529 Male erectile dysfunction, unspecified: Secondary | ICD-10-CM | POA: Diagnosis not present

## 2018-03-08 DIAGNOSIS — R972 Elevated prostate specific antigen [PSA]: Secondary | ICD-10-CM | POA: Diagnosis not present

## 2018-03-08 DIAGNOSIS — E782 Mixed hyperlipidemia: Secondary | ICD-10-CM | POA: Diagnosis not present

## 2018-03-08 DIAGNOSIS — N200 Calculus of kidney: Secondary | ICD-10-CM | POA: Diagnosis not present

## 2018-03-08 DIAGNOSIS — E1169 Type 2 diabetes mellitus with other specified complication: Secondary | ICD-10-CM | POA: Diagnosis not present

## 2018-03-29 DIAGNOSIS — I10 Essential (primary) hypertension: Secondary | ICD-10-CM | POA: Diagnosis not present

## 2018-03-29 DIAGNOSIS — E119 Type 2 diabetes mellitus without complications: Secondary | ICD-10-CM | POA: Diagnosis not present

## 2018-03-29 DIAGNOSIS — E782 Mixed hyperlipidemia: Secondary | ICD-10-CM | POA: Diagnosis not present

## 2018-03-29 DIAGNOSIS — F5221 Male erectile disorder: Secondary | ICD-10-CM | POA: Diagnosis not present

## 2018-04-03 DIAGNOSIS — R399 Unspecified symptoms and signs involving the genitourinary system: Secondary | ICD-10-CM | POA: Diagnosis not present

## 2018-04-03 DIAGNOSIS — N401 Enlarged prostate with lower urinary tract symptoms: Secondary | ICD-10-CM | POA: Diagnosis not present

## 2018-04-03 DIAGNOSIS — N529 Male erectile dysfunction, unspecified: Secondary | ICD-10-CM | POA: Diagnosis not present

## 2018-04-03 DIAGNOSIS — N138 Other obstructive and reflux uropathy: Secondary | ICD-10-CM | POA: Diagnosis not present

## 2018-04-03 DIAGNOSIS — N200 Calculus of kidney: Secondary | ICD-10-CM | POA: Diagnosis not present

## 2018-04-05 DIAGNOSIS — E11319 Type 2 diabetes mellitus with unspecified diabetic retinopathy without macular edema: Secondary | ICD-10-CM | POA: Diagnosis not present

## 2018-04-07 DIAGNOSIS — N210 Calculus in bladder: Secondary | ICD-10-CM | POA: Diagnosis not present

## 2018-04-07 DIAGNOSIS — Z87442 Personal history of urinary calculi: Secondary | ICD-10-CM | POA: Diagnosis not present

## 2018-04-07 DIAGNOSIS — N3289 Other specified disorders of bladder: Secondary | ICD-10-CM | POA: Diagnosis not present

## 2018-04-07 DIAGNOSIS — N529 Male erectile dysfunction, unspecified: Secondary | ICD-10-CM | POA: Diagnosis not present

## 2018-04-07 DIAGNOSIS — N200 Calculus of kidney: Secondary | ICD-10-CM | POA: Diagnosis not present

## 2018-04-26 DIAGNOSIS — Z23 Encounter for immunization: Secondary | ICD-10-CM | POA: Diagnosis not present

## 2018-05-12 DIAGNOSIS — N200 Calculus of kidney: Secondary | ICD-10-CM | POA: Diagnosis not present

## 2018-05-12 DIAGNOSIS — N138 Other obstructive and reflux uropathy: Secondary | ICD-10-CM | POA: Diagnosis not present

## 2018-05-12 DIAGNOSIS — N401 Enlarged prostate with lower urinary tract symptoms: Secondary | ICD-10-CM | POA: Diagnosis not present

## 2018-05-12 DIAGNOSIS — N210 Calculus in bladder: Secondary | ICD-10-CM | POA: Diagnosis not present

## 2018-05-12 DIAGNOSIS — N528 Other male erectile dysfunction: Secondary | ICD-10-CM | POA: Diagnosis not present

## 2018-06-21 DIAGNOSIS — R972 Elevated prostate specific antigen [PSA]: Secondary | ICD-10-CM | POA: Diagnosis not present

## 2018-06-21 DIAGNOSIS — E119 Type 2 diabetes mellitus without complications: Secondary | ICD-10-CM | POA: Diagnosis not present

## 2018-06-21 DIAGNOSIS — I10 Essential (primary) hypertension: Secondary | ICD-10-CM | POA: Diagnosis not present

## 2018-06-21 DIAGNOSIS — E782 Mixed hyperlipidemia: Secondary | ICD-10-CM | POA: Diagnosis not present

## 2018-06-21 DIAGNOSIS — F5221 Male erectile disorder: Secondary | ICD-10-CM | POA: Diagnosis not present

## 2018-06-21 DIAGNOSIS — E1169 Type 2 diabetes mellitus with other specified complication: Secondary | ICD-10-CM | POA: Diagnosis not present

## 2018-06-21 DIAGNOSIS — N401 Enlarged prostate with lower urinary tract symptoms: Secondary | ICD-10-CM | POA: Diagnosis not present

## 2018-06-26 DIAGNOSIS — Z8781 Personal history of (healed) traumatic fracture: Secondary | ICD-10-CM | POA: Diagnosis not present

## 2018-06-26 DIAGNOSIS — R972 Elevated prostate specific antigen [PSA]: Secondary | ICD-10-CM | POA: Diagnosis not present

## 2018-06-26 DIAGNOSIS — E119 Type 2 diabetes mellitus without complications: Secondary | ICD-10-CM | POA: Diagnosis not present

## 2018-06-26 DIAGNOSIS — M19041 Primary osteoarthritis, right hand: Secondary | ICD-10-CM | POA: Diagnosis not present

## 2018-06-26 DIAGNOSIS — N200 Calculus of kidney: Secondary | ICD-10-CM | POA: Diagnosis not present

## 2018-06-26 DIAGNOSIS — I10 Essential (primary) hypertension: Secondary | ICD-10-CM | POA: Diagnosis not present

## 2018-06-26 DIAGNOSIS — R944 Abnormal results of kidney function studies: Secondary | ICD-10-CM | POA: Diagnosis not present

## 2018-06-26 DIAGNOSIS — Z0000 Encounter for general adult medical examination without abnormal findings: Secondary | ICD-10-CM | POA: Diagnosis not present

## 2018-06-26 DIAGNOSIS — E782 Mixed hyperlipidemia: Secondary | ICD-10-CM | POA: Diagnosis not present

## 2018-06-28 DIAGNOSIS — H25013 Cortical age-related cataract, bilateral: Secondary | ICD-10-CM | POA: Diagnosis not present

## 2018-06-28 DIAGNOSIS — H2513 Age-related nuclear cataract, bilateral: Secondary | ICD-10-CM | POA: Diagnosis not present

## 2018-06-28 DIAGNOSIS — H25012 Cortical age-related cataract, left eye: Secondary | ICD-10-CM | POA: Diagnosis not present

## 2018-06-28 DIAGNOSIS — H2512 Age-related nuclear cataract, left eye: Secondary | ICD-10-CM | POA: Diagnosis not present

## 2018-06-28 DIAGNOSIS — H35363 Drusen (degenerative) of macula, bilateral: Secondary | ICD-10-CM | POA: Diagnosis not present

## 2018-06-28 DIAGNOSIS — E119 Type 2 diabetes mellitus without complications: Secondary | ICD-10-CM | POA: Diagnosis not present

## 2018-06-28 DIAGNOSIS — H35033 Hypertensive retinopathy, bilateral: Secondary | ICD-10-CM | POA: Diagnosis not present

## 2018-07-04 DIAGNOSIS — E119 Type 2 diabetes mellitus without complications: Secondary | ICD-10-CM | POA: Diagnosis not present

## 2018-07-04 DIAGNOSIS — N400 Enlarged prostate without lower urinary tract symptoms: Secondary | ICD-10-CM | POA: Diagnosis not present

## 2018-07-04 DIAGNOSIS — N210 Calculus in bladder: Secondary | ICD-10-CM | POA: Diagnosis not present

## 2018-07-04 DIAGNOSIS — Z87442 Personal history of urinary calculi: Secondary | ICD-10-CM | POA: Diagnosis not present

## 2018-07-04 DIAGNOSIS — Z7984 Long term (current) use of oral hypoglycemic drugs: Secondary | ICD-10-CM | POA: Diagnosis not present

## 2018-08-08 DIAGNOSIS — H2512 Age-related nuclear cataract, left eye: Secondary | ICD-10-CM | POA: Diagnosis not present

## 2018-08-08 DIAGNOSIS — H25812 Combined forms of age-related cataract, left eye: Secondary | ICD-10-CM | POA: Diagnosis not present

## 2018-09-14 DIAGNOSIS — H25011 Cortical age-related cataract, right eye: Secondary | ICD-10-CM | POA: Diagnosis not present

## 2018-09-14 DIAGNOSIS — H2511 Age-related nuclear cataract, right eye: Secondary | ICD-10-CM | POA: Diagnosis not present

## 2018-09-26 DIAGNOSIS — H2511 Age-related nuclear cataract, right eye: Secondary | ICD-10-CM | POA: Diagnosis not present

## 2018-09-26 DIAGNOSIS — H25811 Combined forms of age-related cataract, right eye: Secondary | ICD-10-CM | POA: Diagnosis not present

## 2018-10-23 DIAGNOSIS — E782 Mixed hyperlipidemia: Secondary | ICD-10-CM | POA: Diagnosis not present

## 2018-10-23 DIAGNOSIS — R5383 Other fatigue: Secondary | ICD-10-CM | POA: Diagnosis not present

## 2018-10-23 DIAGNOSIS — E1169 Type 2 diabetes mellitus with other specified complication: Secondary | ICD-10-CM | POA: Diagnosis not present

## 2018-10-23 DIAGNOSIS — I10 Essential (primary) hypertension: Secondary | ICD-10-CM | POA: Diagnosis not present

## 2018-10-23 DIAGNOSIS — R944 Abnormal results of kidney function studies: Secondary | ICD-10-CM | POA: Diagnosis not present

## 2018-10-26 DIAGNOSIS — E1122 Type 2 diabetes mellitus with diabetic chronic kidney disease: Secondary | ICD-10-CM | POA: Diagnosis not present

## 2018-10-26 DIAGNOSIS — R972 Elevated prostate specific antigen [PSA]: Secondary | ICD-10-CM | POA: Diagnosis not present

## 2018-10-26 DIAGNOSIS — I10 Essential (primary) hypertension: Secondary | ICD-10-CM | POA: Diagnosis not present

## 2018-10-26 DIAGNOSIS — N189 Chronic kidney disease, unspecified: Secondary | ICD-10-CM | POA: Diagnosis not present

## 2018-10-26 DIAGNOSIS — R944 Abnormal results of kidney function studies: Secondary | ICD-10-CM | POA: Diagnosis not present

## 2018-10-26 DIAGNOSIS — E782 Mixed hyperlipidemia: Secondary | ICD-10-CM | POA: Diagnosis not present

## 2018-10-26 DIAGNOSIS — Z9842 Cataract extraction status, left eye: Secondary | ICD-10-CM | POA: Diagnosis not present

## 2018-10-26 DIAGNOSIS — Z9841 Cataract extraction status, right eye: Secondary | ICD-10-CM | POA: Diagnosis not present

## 2018-11-09 DIAGNOSIS — E782 Mixed hyperlipidemia: Secondary | ICD-10-CM | POA: Diagnosis not present

## 2018-11-09 DIAGNOSIS — R972 Elevated prostate specific antigen [PSA]: Secondary | ICD-10-CM | POA: Diagnosis not present

## 2018-11-09 DIAGNOSIS — I10 Essential (primary) hypertension: Secondary | ICD-10-CM | POA: Diagnosis not present

## 2018-11-09 DIAGNOSIS — Z9849 Cataract extraction status, unspecified eye: Secondary | ICD-10-CM | POA: Diagnosis not present

## 2018-11-09 DIAGNOSIS — N189 Chronic kidney disease, unspecified: Secondary | ICD-10-CM | POA: Diagnosis not present

## 2018-11-09 DIAGNOSIS — E1122 Type 2 diabetes mellitus with diabetic chronic kidney disease: Secondary | ICD-10-CM | POA: Diagnosis not present

## 2018-11-14 DIAGNOSIS — Z0000 Encounter for general adult medical examination without abnormal findings: Secondary | ICD-10-CM | POA: Diagnosis not present

## 2018-11-27 DIAGNOSIS — M1732 Unilateral post-traumatic osteoarthritis, left knee: Secondary | ICD-10-CM | POA: Diagnosis not present

## 2018-11-27 DIAGNOSIS — J309 Allergic rhinitis, unspecified: Secondary | ICD-10-CM | POA: Diagnosis not present

## 2018-11-27 DIAGNOSIS — H547 Unspecified visual loss: Secondary | ICD-10-CM | POA: Diagnosis not present

## 2018-11-27 DIAGNOSIS — H9193 Unspecified hearing loss, bilateral: Secondary | ICD-10-CM | POA: Diagnosis not present

## 2018-11-27 DIAGNOSIS — E785 Hyperlipidemia, unspecified: Secondary | ICD-10-CM | POA: Diagnosis not present

## 2018-11-27 DIAGNOSIS — E119 Type 2 diabetes mellitus without complications: Secondary | ICD-10-CM | POA: Diagnosis not present

## 2018-11-27 DIAGNOSIS — I10 Essential (primary) hypertension: Secondary | ICD-10-CM | POA: Diagnosis not present

## 2018-11-27 DIAGNOSIS — N400 Enlarged prostate without lower urinary tract symptoms: Secondary | ICD-10-CM | POA: Diagnosis not present

## 2018-11-27 DIAGNOSIS — M1712 Unilateral primary osteoarthritis, left knee: Secondary | ICD-10-CM | POA: Diagnosis not present

## 2019-02-01 DIAGNOSIS — N401 Enlarged prostate with lower urinary tract symptoms: Secondary | ICD-10-CM | POA: Diagnosis not present

## 2019-02-01 DIAGNOSIS — N200 Calculus of kidney: Secondary | ICD-10-CM | POA: Diagnosis not present

## 2019-02-01 DIAGNOSIS — N138 Other obstructive and reflux uropathy: Secondary | ICD-10-CM | POA: Diagnosis not present

## 2019-02-01 DIAGNOSIS — N529 Male erectile dysfunction, unspecified: Secondary | ICD-10-CM | POA: Diagnosis not present

## 2019-03-20 DIAGNOSIS — K219 Gastro-esophageal reflux disease without esophagitis: Secondary | ICD-10-CM | POA: Diagnosis not present

## 2019-04-10 DIAGNOSIS — M19041 Primary osteoarthritis, right hand: Secondary | ICD-10-CM | POA: Diagnosis not present

## 2019-04-10 DIAGNOSIS — I10 Essential (primary) hypertension: Secondary | ICD-10-CM | POA: Diagnosis not present

## 2019-04-10 DIAGNOSIS — E782 Mixed hyperlipidemia: Secondary | ICD-10-CM | POA: Diagnosis not present

## 2019-04-10 DIAGNOSIS — E119 Type 2 diabetes mellitus without complications: Secondary | ICD-10-CM | POA: Diagnosis not present

## 2019-04-10 DIAGNOSIS — Z8781 Personal history of (healed) traumatic fracture: Secondary | ICD-10-CM | POA: Diagnosis not present

## 2019-04-10 DIAGNOSIS — R972 Elevated prostate specific antigen [PSA]: Secondary | ICD-10-CM | POA: Diagnosis not present

## 2019-04-10 DIAGNOSIS — R944 Abnormal results of kidney function studies: Secondary | ICD-10-CM | POA: Diagnosis not present

## 2019-04-10 DIAGNOSIS — Z0000 Encounter for general adult medical examination without abnormal findings: Secondary | ICD-10-CM | POA: Diagnosis not present

## 2019-04-10 DIAGNOSIS — N200 Calculus of kidney: Secondary | ICD-10-CM | POA: Diagnosis not present

## 2019-05-02 DIAGNOSIS — E1122 Type 2 diabetes mellitus with diabetic chronic kidney disease: Secondary | ICD-10-CM | POA: Diagnosis not present

## 2019-05-02 DIAGNOSIS — I10 Essential (primary) hypertension: Secondary | ICD-10-CM | POA: Diagnosis not present

## 2019-05-02 DIAGNOSIS — E1169 Type 2 diabetes mellitus with other specified complication: Secondary | ICD-10-CM | POA: Diagnosis not present

## 2019-05-02 DIAGNOSIS — E782 Mixed hyperlipidemia: Secondary | ICD-10-CM | POA: Diagnosis not present

## 2019-05-10 DIAGNOSIS — N189 Chronic kidney disease, unspecified: Secondary | ICD-10-CM | POA: Diagnosis not present

## 2019-05-10 DIAGNOSIS — Z23 Encounter for immunization: Secondary | ICD-10-CM | POA: Diagnosis not present

## 2019-05-10 DIAGNOSIS — R972 Elevated prostate specific antigen [PSA]: Secondary | ICD-10-CM | POA: Diagnosis not present

## 2019-05-10 DIAGNOSIS — Z9849 Cataract extraction status, unspecified eye: Secondary | ICD-10-CM | POA: Diagnosis not present

## 2019-05-10 DIAGNOSIS — E1122 Type 2 diabetes mellitus with diabetic chronic kidney disease: Secondary | ICD-10-CM | POA: Diagnosis not present

## 2019-05-10 DIAGNOSIS — E782 Mixed hyperlipidemia: Secondary | ICD-10-CM | POA: Diagnosis not present

## 2019-05-10 DIAGNOSIS — I10 Essential (primary) hypertension: Secondary | ICD-10-CM | POA: Diagnosis not present

## 2019-05-10 DIAGNOSIS — K219 Gastro-esophageal reflux disease without esophagitis: Secondary | ICD-10-CM | POA: Diagnosis not present

## 2019-05-21 DIAGNOSIS — Z9849 Cataract extraction status, unspecified eye: Secondary | ICD-10-CM | POA: Diagnosis not present

## 2019-05-21 DIAGNOSIS — H52223 Regular astigmatism, bilateral: Secondary | ICD-10-CM | POA: Diagnosis not present

## 2019-05-21 DIAGNOSIS — H5213 Myopia, bilateral: Secondary | ICD-10-CM | POA: Diagnosis not present

## 2019-05-21 DIAGNOSIS — H524 Presbyopia: Secondary | ICD-10-CM | POA: Diagnosis not present

## 2019-05-21 DIAGNOSIS — E119 Type 2 diabetes mellitus without complications: Secondary | ICD-10-CM | POA: Diagnosis not present

## 2019-05-21 DIAGNOSIS — Z961 Presence of intraocular lens: Secondary | ICD-10-CM | POA: Diagnosis not present

## 2019-05-21 DIAGNOSIS — Z7984 Long term (current) use of oral hypoglycemic drugs: Secondary | ICD-10-CM | POA: Diagnosis not present

## 2019-05-29 DIAGNOSIS — Z9849 Cataract extraction status, unspecified eye: Secondary | ICD-10-CM | POA: Diagnosis not present

## 2019-05-29 DIAGNOSIS — K219 Gastro-esophageal reflux disease without esophagitis: Secondary | ICD-10-CM | POA: Diagnosis not present

## 2019-05-29 DIAGNOSIS — E782 Mixed hyperlipidemia: Secondary | ICD-10-CM | POA: Diagnosis not present

## 2019-05-29 DIAGNOSIS — Z23 Encounter for immunization: Secondary | ICD-10-CM | POA: Diagnosis not present

## 2019-05-29 DIAGNOSIS — I10 Essential (primary) hypertension: Secondary | ICD-10-CM | POA: Diagnosis not present

## 2019-05-29 DIAGNOSIS — R972 Elevated prostate specific antigen [PSA]: Secondary | ICD-10-CM | POA: Diagnosis not present

## 2019-05-29 DIAGNOSIS — E1122 Type 2 diabetes mellitus with diabetic chronic kidney disease: Secondary | ICD-10-CM | POA: Diagnosis not present

## 2019-05-29 DIAGNOSIS — N189 Chronic kidney disease, unspecified: Secondary | ICD-10-CM | POA: Diagnosis not present

## 2019-07-11 DIAGNOSIS — H8110 Benign paroxysmal vertigo, unspecified ear: Secondary | ICD-10-CM | POA: Diagnosis not present

## 2019-07-11 DIAGNOSIS — M25561 Pain in right knee: Secondary | ICD-10-CM | POA: Diagnosis not present

## 2019-08-14 DIAGNOSIS — I129 Hypertensive chronic kidney disease with stage 1 through stage 4 chronic kidney disease, or unspecified chronic kidney disease: Secondary | ICD-10-CM | POA: Diagnosis not present

## 2019-08-14 DIAGNOSIS — R972 Elevated prostate specific antigen [PSA]: Secondary | ICD-10-CM | POA: Diagnosis not present

## 2019-08-14 DIAGNOSIS — N189 Chronic kidney disease, unspecified: Secondary | ICD-10-CM | POA: Diagnosis not present

## 2019-08-14 DIAGNOSIS — E1122 Type 2 diabetes mellitus with diabetic chronic kidney disease: Secondary | ICD-10-CM | POA: Diagnosis not present

## 2019-08-14 DIAGNOSIS — Z9849 Cataract extraction status, unspecified eye: Secondary | ICD-10-CM | POA: Diagnosis not present

## 2019-08-14 DIAGNOSIS — Z23 Encounter for immunization: Secondary | ICD-10-CM | POA: Diagnosis not present

## 2019-08-14 DIAGNOSIS — E782 Mixed hyperlipidemia: Secondary | ICD-10-CM | POA: Diagnosis not present

## 2019-08-14 DIAGNOSIS — K219 Gastro-esophageal reflux disease without esophagitis: Secondary | ICD-10-CM | POA: Diagnosis not present

## 2019-08-14 DIAGNOSIS — I10 Essential (primary) hypertension: Secondary | ICD-10-CM | POA: Diagnosis not present

## 2019-08-30 DIAGNOSIS — E782 Mixed hyperlipidemia: Secondary | ICD-10-CM | POA: Diagnosis not present

## 2019-08-30 DIAGNOSIS — Z6825 Body mass index (BMI) 25.0-25.9, adult: Secondary | ICD-10-CM | POA: Diagnosis not present

## 2019-08-30 DIAGNOSIS — F5221 Male erectile disorder: Secondary | ICD-10-CM | POA: Diagnosis not present

## 2019-08-30 DIAGNOSIS — N189 Chronic kidney disease, unspecified: Secondary | ICD-10-CM | POA: Diagnosis not present

## 2019-08-30 DIAGNOSIS — Z9842 Cataract extraction status, left eye: Secondary | ICD-10-CM | POA: Diagnosis not present

## 2019-08-30 DIAGNOSIS — E1122 Type 2 diabetes mellitus with diabetic chronic kidney disease: Secondary | ICD-10-CM | POA: Diagnosis not present

## 2019-08-30 DIAGNOSIS — Z0000 Encounter for general adult medical examination without abnormal findings: Secondary | ICD-10-CM | POA: Diagnosis not present

## 2019-08-30 DIAGNOSIS — H814 Vertigo of central origin: Secondary | ICD-10-CM | POA: Diagnosis not present

## 2019-08-30 DIAGNOSIS — Z8781 Personal history of (healed) traumatic fracture: Secondary | ICD-10-CM | POA: Diagnosis not present

## 2019-09-05 DIAGNOSIS — K219 Gastro-esophageal reflux disease without esophagitis: Secondary | ICD-10-CM | POA: Diagnosis not present

## 2019-09-05 DIAGNOSIS — M25561 Pain in right knee: Secondary | ICD-10-CM | POA: Diagnosis not present

## 2019-09-05 DIAGNOSIS — H903 Sensorineural hearing loss, bilateral: Secondary | ICD-10-CM | POA: Diagnosis not present

## 2019-09-05 DIAGNOSIS — E782 Mixed hyperlipidemia: Secondary | ICD-10-CM | POA: Diagnosis not present

## 2019-09-05 DIAGNOSIS — I10 Essential (primary) hypertension: Secondary | ICD-10-CM | POA: Diagnosis not present

## 2019-09-05 DIAGNOSIS — R972 Elevated prostate specific antigen [PSA]: Secondary | ICD-10-CM | POA: Diagnosis not present

## 2019-09-05 DIAGNOSIS — Z9849 Cataract extraction status, unspecified eye: Secondary | ICD-10-CM | POA: Diagnosis not present

## 2019-09-05 DIAGNOSIS — E1122 Type 2 diabetes mellitus with diabetic chronic kidney disease: Secondary | ICD-10-CM | POA: Diagnosis not present

## 2019-09-27 DIAGNOSIS — H903 Sensorineural hearing loss, bilateral: Secondary | ICD-10-CM | POA: Diagnosis not present

## 2019-11-04 DIAGNOSIS — I129 Hypertensive chronic kidney disease with stage 1 through stage 4 chronic kidney disease, or unspecified chronic kidney disease: Secondary | ICD-10-CM | POA: Diagnosis not present

## 2019-11-04 DIAGNOSIS — R972 Elevated prostate specific antigen [PSA]: Secondary | ICD-10-CM | POA: Diagnosis not present

## 2019-11-04 DIAGNOSIS — N189 Chronic kidney disease, unspecified: Secondary | ICD-10-CM | POA: Diagnosis not present

## 2019-11-04 DIAGNOSIS — E1122 Type 2 diabetes mellitus with diabetic chronic kidney disease: Secondary | ICD-10-CM | POA: Diagnosis not present

## 2019-11-04 DIAGNOSIS — Z23 Encounter for immunization: Secondary | ICD-10-CM | POA: Diagnosis not present

## 2019-11-04 DIAGNOSIS — K219 Gastro-esophageal reflux disease without esophagitis: Secondary | ICD-10-CM | POA: Diagnosis not present

## 2019-11-04 DIAGNOSIS — E782 Mixed hyperlipidemia: Secondary | ICD-10-CM | POA: Diagnosis not present

## 2019-11-04 DIAGNOSIS — Z9849 Cataract extraction status, unspecified eye: Secondary | ICD-10-CM | POA: Diagnosis not present

## 2019-11-04 DIAGNOSIS — I10 Essential (primary) hypertension: Secondary | ICD-10-CM | POA: Diagnosis not present

## 2019-12-04 DIAGNOSIS — H259 Unspecified age-related cataract: Secondary | ICD-10-CM | POA: Diagnosis not present

## 2019-12-04 DIAGNOSIS — F5221 Male erectile disorder: Secondary | ICD-10-CM | POA: Diagnosis not present

## 2019-12-04 DIAGNOSIS — N401 Enlarged prostate with lower urinary tract symptoms: Secondary | ICD-10-CM | POA: Diagnosis not present

## 2019-12-04 DIAGNOSIS — I10 Essential (primary) hypertension: Secondary | ICD-10-CM | POA: Diagnosis not present

## 2019-12-04 DIAGNOSIS — H903 Sensorineural hearing loss, bilateral: Secondary | ICD-10-CM | POA: Diagnosis not present

## 2019-12-04 DIAGNOSIS — E782 Mixed hyperlipidemia: Secondary | ICD-10-CM | POA: Diagnosis not present

## 2019-12-04 DIAGNOSIS — Z0000 Encounter for general adult medical examination without abnormal findings: Secondary | ICD-10-CM | POA: Diagnosis not present

## 2019-12-04 DIAGNOSIS — E119 Type 2 diabetes mellitus without complications: Secondary | ICD-10-CM | POA: Diagnosis not present

## 2019-12-04 DIAGNOSIS — E1169 Type 2 diabetes mellitus with other specified complication: Secondary | ICD-10-CM | POA: Diagnosis not present

## 2019-12-04 DIAGNOSIS — E1122 Type 2 diabetes mellitus with diabetic chronic kidney disease: Secondary | ICD-10-CM | POA: Diagnosis not present

## 2019-12-18 DIAGNOSIS — E782 Mixed hyperlipidemia: Secondary | ICD-10-CM | POA: Diagnosis not present

## 2019-12-18 DIAGNOSIS — M25561 Pain in right knee: Secondary | ICD-10-CM | POA: Diagnosis not present

## 2019-12-18 DIAGNOSIS — I10 Essential (primary) hypertension: Secondary | ICD-10-CM | POA: Diagnosis not present

## 2019-12-18 DIAGNOSIS — H903 Sensorineural hearing loss, bilateral: Secondary | ICD-10-CM | POA: Diagnosis not present

## 2019-12-18 DIAGNOSIS — E1122 Type 2 diabetes mellitus with diabetic chronic kidney disease: Secondary | ICD-10-CM | POA: Diagnosis not present

## 2019-12-18 DIAGNOSIS — R972 Elevated prostate specific antigen [PSA]: Secondary | ICD-10-CM | POA: Diagnosis not present

## 2019-12-18 DIAGNOSIS — Z9849 Cataract extraction status, unspecified eye: Secondary | ICD-10-CM | POA: Diagnosis not present

## 2019-12-18 DIAGNOSIS — K219 Gastro-esophageal reflux disease without esophagitis: Secondary | ICD-10-CM | POA: Diagnosis not present

## 2020-02-05 DIAGNOSIS — N138 Other obstructive and reflux uropathy: Secondary | ICD-10-CM | POA: Diagnosis not present

## 2020-02-05 DIAGNOSIS — N401 Enlarged prostate with lower urinary tract symptoms: Secondary | ICD-10-CM | POA: Diagnosis not present

## 2020-02-05 DIAGNOSIS — N529 Male erectile dysfunction, unspecified: Secondary | ICD-10-CM | POA: Diagnosis not present

## 2020-02-05 DIAGNOSIS — R972 Elevated prostate specific antigen [PSA]: Secondary | ICD-10-CM | POA: Diagnosis not present

## 2020-02-05 DIAGNOSIS — N200 Calculus of kidney: Secondary | ICD-10-CM | POA: Diagnosis not present

## 2020-02-06 DIAGNOSIS — N400 Enlarged prostate without lower urinary tract symptoms: Secondary | ICD-10-CM | POA: Diagnosis not present

## 2020-02-06 DIAGNOSIS — N200 Calculus of kidney: Secondary | ICD-10-CM | POA: Diagnosis not present

## 2020-03-21 DIAGNOSIS — I10 Essential (primary) hypertension: Secondary | ICD-10-CM | POA: Diagnosis not present

## 2020-03-21 DIAGNOSIS — K219 Gastro-esophageal reflux disease without esophagitis: Secondary | ICD-10-CM | POA: Diagnosis not present

## 2020-03-21 DIAGNOSIS — Z23 Encounter for immunization: Secondary | ICD-10-CM | POA: Diagnosis not present

## 2020-03-21 DIAGNOSIS — E782 Mixed hyperlipidemia: Secondary | ICD-10-CM | POA: Diagnosis not present

## 2020-03-21 DIAGNOSIS — N189 Chronic kidney disease, unspecified: Secondary | ICD-10-CM | POA: Diagnosis not present

## 2020-03-21 DIAGNOSIS — Z9849 Cataract extraction status, unspecified eye: Secondary | ICD-10-CM | POA: Diagnosis not present

## 2020-03-21 DIAGNOSIS — R972 Elevated prostate specific antigen [PSA]: Secondary | ICD-10-CM | POA: Diagnosis not present

## 2020-03-21 DIAGNOSIS — E1122 Type 2 diabetes mellitus with diabetic chronic kidney disease: Secondary | ICD-10-CM | POA: Diagnosis not present

## 2020-03-21 DIAGNOSIS — I129 Hypertensive chronic kidney disease with stage 1 through stage 4 chronic kidney disease, or unspecified chronic kidney disease: Secondary | ICD-10-CM | POA: Diagnosis not present

## 2020-04-24 DIAGNOSIS — Z6825 Body mass index (BMI) 25.0-25.9, adult: Secondary | ICD-10-CM | POA: Diagnosis not present

## 2020-04-24 DIAGNOSIS — Z9841 Cataract extraction status, right eye: Secondary | ICD-10-CM | POA: Diagnosis not present

## 2020-04-24 DIAGNOSIS — Z8781 Personal history of (healed) traumatic fracture: Secondary | ICD-10-CM | POA: Diagnosis not present

## 2020-04-24 DIAGNOSIS — I10 Essential (primary) hypertension: Secondary | ICD-10-CM | POA: Diagnosis not present

## 2020-04-24 DIAGNOSIS — E782 Mixed hyperlipidemia: Secondary | ICD-10-CM | POA: Diagnosis not present

## 2020-04-24 DIAGNOSIS — N189 Chronic kidney disease, unspecified: Secondary | ICD-10-CM | POA: Diagnosis not present

## 2020-04-24 DIAGNOSIS — E1122 Type 2 diabetes mellitus with diabetic chronic kidney disease: Secondary | ICD-10-CM | POA: Diagnosis not present

## 2020-04-24 DIAGNOSIS — I129 Hypertensive chronic kidney disease with stage 1 through stage 4 chronic kidney disease, or unspecified chronic kidney disease: Secondary | ICD-10-CM | POA: Diagnosis not present

## 2020-04-24 DIAGNOSIS — F5221 Male erectile disorder: Secondary | ICD-10-CM | POA: Diagnosis not present

## 2020-04-24 DIAGNOSIS — Z0000 Encounter for general adult medical examination without abnormal findings: Secondary | ICD-10-CM | POA: Diagnosis not present

## 2020-04-24 DIAGNOSIS — R972 Elevated prostate specific antigen [PSA]: Secondary | ICD-10-CM | POA: Diagnosis not present

## 2020-04-24 DIAGNOSIS — Z9842 Cataract extraction status, left eye: Secondary | ICD-10-CM | POA: Diagnosis not present

## 2020-04-24 DIAGNOSIS — Z9849 Cataract extraction status, unspecified eye: Secondary | ICD-10-CM | POA: Diagnosis not present

## 2020-04-24 DIAGNOSIS — Z23 Encounter for immunization: Secondary | ICD-10-CM | POA: Diagnosis not present

## 2020-04-29 DIAGNOSIS — Z9849 Cataract extraction status, unspecified eye: Secondary | ICD-10-CM | POA: Diagnosis not present

## 2020-04-29 DIAGNOSIS — M25561 Pain in right knee: Secondary | ICD-10-CM | POA: Diagnosis not present

## 2020-04-29 DIAGNOSIS — H903 Sensorineural hearing loss, bilateral: Secondary | ICD-10-CM | POA: Diagnosis not present

## 2020-04-29 DIAGNOSIS — Z23 Encounter for immunization: Secondary | ICD-10-CM | POA: Diagnosis not present

## 2020-04-29 DIAGNOSIS — R972 Elevated prostate specific antigen [PSA]: Secondary | ICD-10-CM | POA: Diagnosis not present

## 2020-04-29 DIAGNOSIS — K219 Gastro-esophageal reflux disease without esophagitis: Secondary | ICD-10-CM | POA: Diagnosis not present

## 2020-04-29 DIAGNOSIS — E782 Mixed hyperlipidemia: Secondary | ICD-10-CM | POA: Diagnosis not present

## 2020-04-29 DIAGNOSIS — I10 Essential (primary) hypertension: Secondary | ICD-10-CM | POA: Diagnosis not present

## 2020-04-29 DIAGNOSIS — E1122 Type 2 diabetes mellitus with diabetic chronic kidney disease: Secondary | ICD-10-CM | POA: Diagnosis not present

## 2020-05-09 DIAGNOSIS — N138 Other obstructive and reflux uropathy: Secondary | ICD-10-CM | POA: Diagnosis not present

## 2020-05-09 DIAGNOSIS — R972 Elevated prostate specific antigen [PSA]: Secondary | ICD-10-CM | POA: Diagnosis not present

## 2020-05-09 DIAGNOSIS — N401 Enlarged prostate with lower urinary tract symptoms: Secondary | ICD-10-CM | POA: Diagnosis not present

## 2020-06-06 DIAGNOSIS — K219 Gastro-esophageal reflux disease without esophagitis: Secondary | ICD-10-CM | POA: Diagnosis not present

## 2020-06-06 DIAGNOSIS — Z23 Encounter for immunization: Secondary | ICD-10-CM | POA: Diagnosis not present

## 2020-06-06 DIAGNOSIS — R972 Elevated prostate specific antigen [PSA]: Secondary | ICD-10-CM | POA: Diagnosis not present

## 2020-06-06 DIAGNOSIS — I10 Essential (primary) hypertension: Secondary | ICD-10-CM | POA: Diagnosis not present

## 2020-06-06 DIAGNOSIS — E782 Mixed hyperlipidemia: Secondary | ICD-10-CM | POA: Diagnosis not present

## 2020-06-06 DIAGNOSIS — N189 Chronic kidney disease, unspecified: Secondary | ICD-10-CM | POA: Diagnosis not present

## 2020-06-06 DIAGNOSIS — Z9849 Cataract extraction status, unspecified eye: Secondary | ICD-10-CM | POA: Diagnosis not present

## 2020-06-06 DIAGNOSIS — I129 Hypertensive chronic kidney disease with stage 1 through stage 4 chronic kidney disease, or unspecified chronic kidney disease: Secondary | ICD-10-CM | POA: Diagnosis not present

## 2020-06-06 DIAGNOSIS — E1122 Type 2 diabetes mellitus with diabetic chronic kidney disease: Secondary | ICD-10-CM | POA: Diagnosis not present

## 2020-07-18 DIAGNOSIS — E1122 Type 2 diabetes mellitus with diabetic chronic kidney disease: Secondary | ICD-10-CM | POA: Diagnosis not present

## 2020-07-18 DIAGNOSIS — Z9849 Cataract extraction status, unspecified eye: Secondary | ICD-10-CM | POA: Diagnosis not present

## 2020-07-18 DIAGNOSIS — I10 Essential (primary) hypertension: Secondary | ICD-10-CM | POA: Diagnosis not present

## 2020-07-18 DIAGNOSIS — E782 Mixed hyperlipidemia: Secondary | ICD-10-CM | POA: Diagnosis not present

## 2020-07-18 DIAGNOSIS — R972 Elevated prostate specific antigen [PSA]: Secondary | ICD-10-CM | POA: Diagnosis not present

## 2020-07-18 DIAGNOSIS — K219 Gastro-esophageal reflux disease without esophagitis: Secondary | ICD-10-CM | POA: Diagnosis not present

## 2020-07-18 DIAGNOSIS — Z23 Encounter for immunization: Secondary | ICD-10-CM | POA: Diagnosis not present

## 2020-07-18 DIAGNOSIS — I129 Hypertensive chronic kidney disease with stage 1 through stage 4 chronic kidney disease, or unspecified chronic kidney disease: Secondary | ICD-10-CM | POA: Diagnosis not present

## 2020-07-18 DIAGNOSIS — N189 Chronic kidney disease, unspecified: Secondary | ICD-10-CM | POA: Diagnosis not present

## 2020-08-25 DIAGNOSIS — H52223 Regular astigmatism, bilateral: Secondary | ICD-10-CM | POA: Diagnosis not present

## 2020-08-25 DIAGNOSIS — Z7984 Long term (current) use of oral hypoglycemic drugs: Secondary | ICD-10-CM | POA: Diagnosis not present

## 2020-08-25 DIAGNOSIS — H26493 Other secondary cataract, bilateral: Secondary | ICD-10-CM | POA: Diagnosis not present

## 2020-08-25 DIAGNOSIS — Z961 Presence of intraocular lens: Secondary | ICD-10-CM | POA: Diagnosis not present

## 2020-08-25 DIAGNOSIS — H524 Presbyopia: Secondary | ICD-10-CM | POA: Diagnosis not present

## 2020-08-25 DIAGNOSIS — E119 Type 2 diabetes mellitus without complications: Secondary | ICD-10-CM | POA: Diagnosis not present

## 2020-09-04 DIAGNOSIS — N200 Calculus of kidney: Secondary | ICD-10-CM | POA: Diagnosis not present

## 2020-09-04 DIAGNOSIS — N138 Other obstructive and reflux uropathy: Secondary | ICD-10-CM | POA: Diagnosis not present

## 2020-09-04 DIAGNOSIS — N529 Male erectile dysfunction, unspecified: Secondary | ICD-10-CM | POA: Diagnosis not present

## 2020-09-04 DIAGNOSIS — R972 Elevated prostate specific antigen [PSA]: Secondary | ICD-10-CM | POA: Diagnosis not present

## 2020-09-04 DIAGNOSIS — N401 Enlarged prostate with lower urinary tract symptoms: Secondary | ICD-10-CM | POA: Diagnosis not present

## 2020-09-15 DIAGNOSIS — E782 Mixed hyperlipidemia: Secondary | ICD-10-CM | POA: Diagnosis not present

## 2020-09-15 DIAGNOSIS — Z9849 Cataract extraction status, unspecified eye: Secondary | ICD-10-CM | POA: Diagnosis not present

## 2020-09-15 DIAGNOSIS — K219 Gastro-esophageal reflux disease without esophagitis: Secondary | ICD-10-CM | POA: Diagnosis not present

## 2020-09-15 DIAGNOSIS — E1122 Type 2 diabetes mellitus with diabetic chronic kidney disease: Secondary | ICD-10-CM | POA: Diagnosis not present

## 2020-09-15 DIAGNOSIS — I10 Essential (primary) hypertension: Secondary | ICD-10-CM | POA: Diagnosis not present

## 2020-09-15 DIAGNOSIS — R972 Elevated prostate specific antigen [PSA]: Secondary | ICD-10-CM | POA: Diagnosis not present

## 2020-09-24 DIAGNOSIS — E1122 Type 2 diabetes mellitus with diabetic chronic kidney disease: Secondary | ICD-10-CM | POA: Diagnosis not present

## 2020-09-24 DIAGNOSIS — N189 Chronic kidney disease, unspecified: Secondary | ICD-10-CM | POA: Diagnosis not present

## 2020-09-24 DIAGNOSIS — N401 Enlarged prostate with lower urinary tract symptoms: Secondary | ICD-10-CM | POA: Diagnosis not present

## 2020-09-24 DIAGNOSIS — N529 Male erectile dysfunction, unspecified: Secondary | ICD-10-CM | POA: Diagnosis not present

## 2020-09-24 DIAGNOSIS — E782 Mixed hyperlipidemia: Secondary | ICD-10-CM | POA: Diagnosis not present

## 2020-09-24 DIAGNOSIS — I129 Hypertensive chronic kidney disease with stage 1 through stage 4 chronic kidney disease, or unspecified chronic kidney disease: Secondary | ICD-10-CM | POA: Diagnosis not present

## 2020-09-24 DIAGNOSIS — R972 Elevated prostate specific antigen [PSA]: Secondary | ICD-10-CM | POA: Diagnosis not present

## 2020-09-24 DIAGNOSIS — Z6825 Body mass index (BMI) 25.0-25.9, adult: Secondary | ICD-10-CM | POA: Diagnosis not present

## 2020-09-24 DIAGNOSIS — I10 Essential (primary) hypertension: Secondary | ICD-10-CM | POA: Diagnosis not present

## 2020-09-29 DIAGNOSIS — R972 Elevated prostate specific antigen [PSA]: Secondary | ICD-10-CM | POA: Diagnosis not present

## 2020-09-29 DIAGNOSIS — Z9849 Cataract extraction status, unspecified eye: Secondary | ICD-10-CM | POA: Diagnosis not present

## 2020-09-29 DIAGNOSIS — I10 Essential (primary) hypertension: Secondary | ICD-10-CM | POA: Diagnosis not present

## 2020-09-29 DIAGNOSIS — K219 Gastro-esophageal reflux disease without esophagitis: Secondary | ICD-10-CM | POA: Diagnosis not present

## 2020-09-29 DIAGNOSIS — E1122 Type 2 diabetes mellitus with diabetic chronic kidney disease: Secondary | ICD-10-CM | POA: Diagnosis not present

## 2020-09-29 DIAGNOSIS — H903 Sensorineural hearing loss, bilateral: Secondary | ICD-10-CM | POA: Diagnosis not present

## 2020-09-29 DIAGNOSIS — E875 Hyperkalemia: Secondary | ICD-10-CM | POA: Diagnosis not present

## 2020-09-29 DIAGNOSIS — E782 Mixed hyperlipidemia: Secondary | ICD-10-CM | POA: Diagnosis not present

## 2020-09-29 DIAGNOSIS — M25561 Pain in right knee: Secondary | ICD-10-CM | POA: Diagnosis not present

## 2020-10-13 DIAGNOSIS — M5432 Sciatica, left side: Secondary | ICD-10-CM | POA: Diagnosis not present

## 2020-10-15 DIAGNOSIS — K219 Gastro-esophageal reflux disease without esophagitis: Secondary | ICD-10-CM | POA: Diagnosis not present

## 2020-10-15 DIAGNOSIS — H903 Sensorineural hearing loss, bilateral: Secondary | ICD-10-CM | POA: Diagnosis not present

## 2020-10-15 DIAGNOSIS — M25561 Pain in right knee: Secondary | ICD-10-CM | POA: Diagnosis not present

## 2020-10-15 DIAGNOSIS — E782 Mixed hyperlipidemia: Secondary | ICD-10-CM | POA: Diagnosis not present

## 2020-10-15 DIAGNOSIS — I10 Essential (primary) hypertension: Secondary | ICD-10-CM | POA: Diagnosis not present

## 2020-10-15 DIAGNOSIS — E1122 Type 2 diabetes mellitus with diabetic chronic kidney disease: Secondary | ICD-10-CM | POA: Diagnosis not present

## 2020-10-15 DIAGNOSIS — E875 Hyperkalemia: Secondary | ICD-10-CM | POA: Diagnosis not present

## 2020-10-21 DIAGNOSIS — M5432 Sciatica, left side: Secondary | ICD-10-CM | POA: Diagnosis not present

## 2020-11-17 ENCOUNTER — Other Ambulatory Visit: Payer: Self-pay

## 2020-11-17 ENCOUNTER — Ambulatory Visit: Payer: Medicare PPO

## 2020-11-17 ENCOUNTER — Ambulatory Visit: Payer: Medicare PPO | Admitting: Orthopedic Surgery

## 2020-11-17 ENCOUNTER — Other Ambulatory Visit: Payer: Self-pay | Admitting: Orthopedic Surgery

## 2020-11-17 ENCOUNTER — Encounter: Payer: Self-pay | Admitting: Orthopedic Surgery

## 2020-11-17 VITALS — BP 180/84 | HR 71 | Ht 70.0 in | Wt 180.0 lb

## 2020-11-17 DIAGNOSIS — M25552 Pain in left hip: Secondary | ICD-10-CM

## 2020-11-17 DIAGNOSIS — Z8781 Personal history of (healed) traumatic fracture: Secondary | ICD-10-CM | POA: Diagnosis not present

## 2020-11-17 DIAGNOSIS — S72142D Displaced intertrochanteric fracture of left femur, subsequent encounter for closed fracture with routine healing: Secondary | ICD-10-CM | POA: Diagnosis not present

## 2020-11-17 DIAGNOSIS — M47816 Spondylosis without myelopathy or radiculopathy, lumbar region: Secondary | ICD-10-CM

## 2020-11-17 MED ORDER — MELOXICAM 7.5 MG PO TABS: 7.5000 mg | ORAL_TABLET | Freq: Every day | ORAL | 0 refills | Status: DC

## 2020-11-17 MED ORDER — MELOXICAM 7.5 MG PO TABS: 7.5000 mg | ORAL_TABLET | Freq: Every day | ORAL | 5 refills | Status: DC

## 2020-11-17 NOTE — Patient Instructions (Signed)
Radicular Pain Radicular pain is a type of pain that spreads from your back or neck along a spinal nerve. Spinal nerves are nerves that leave the spinal cord and go to the muscles. Radicular pain is sometimes called radiculopathy, radiculitis, or a pinched nerve. When you have this type of pain, you may also have weakness, numbness, or tingling in the area of your body that is supplied by the nerve. The pain may feel sharp and burning. Depending on which spinal nerve is affected, the pain may occur in the:  Neck area (cervical radicular pain). You may also feel pain, numbness, weakness, or tingling in the arms.  Mid-spine area (thoracic radicular pain). You would feel this pain in the back and chest. This type is rare.  Lower back area (lumbar radicular pain). You would feel this pain as low back pain. You may feel pain, numbness, weakness, or tingling in the buttocks or legs. Sciatica is a type of lumbar radicular pain that shoots down the back of the leg. Radicular pain occurs when one of the spinal nerves becomes irritated or squeezed (compressed). It is often caused by something pushing on a spinal nerve, such as one of the bones of the spine (vertebrae) or one of the round cushions between vertebrae (intervertebral disks). This can result from:  An injury.  Wear and tear or aging of a disk.  The growth of a bone spur that pushes on the nerve. Radicular pain often goes away when you follow instructions from your health care provider for relieving pain at home. Follow these instructions at home: Managing pain  If directed, put ice on the affected area: ? Put ice in a plastic bag. ? Place a towel between your skin and the bag. ? Leave the ice on for 20 minutes, 2-3 times a day.  If directed, apply heat to the affected area as often as told by your health care provider. Use the heat source that your health care provider recommends, such as a moist heat pack or a heating pad. ? Place a towel  between your skin and the heat source. ? Leave the heat on for 20-30 minutes. ? Remove the heat if your skin turns bright red. This is especially important if you are unable to feel pain, heat, or cold. You may have a greater risk of getting burned.      Activity  Do not sit or rest in bed for long periods of time.  Try to stay as active as possible. Ask your health care provider what type of exercise or activity is best for you.  Avoid activities that make your pain worse, such as bending and lifting.  Do not lift anything that is heavier than 10 lb (4.5 kg), or the limit that you are told, until your health care provider says that it is safe.  Practice using proper technique when lifting items. Proper lifting technique involves bending your knees and rising up.  Do strength and range-of-motion exercises only as told by your health care provider or physical therapist.   General instructions  Take over-the-counter and prescription medicines only as told by your health care provider.  Pay attention to any changes in your symptoms.  Keep all follow-up visits as told by your health care provider. This is important. ? Your health care provider may send you to a physical therapist to help with this pain. Contact a health care provider if:  Your pain and other symptoms get worse.  Your pain medicine   is not helping.  Your pain has not improved after a few weeks of home care.  You have a fever. Get help right away if:  You have severe pain, weakness, or numbness.  You have difficulty with bladder or bowel control. Summary  Radicular pain is a type of pain that spreads from your back or neck along a spinal nerve.  When you have radicular pain, you may also have weakness, numbness, or tingling in the area of your body that is supplied by the nerve.  The pain may feel sharp or burning.  Radicular pain may be treated with ice, heat, medicines, or physical therapy. This  information is not intended to replace advice given to you by your health care provider. Make sure you discuss any questions you have with your health care provider. Document Revised: 01/17/2018 Document Reviewed: 01/17/2018 Elsevier Patient Education  2021 Elsevier Inc.  
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
## 2020-11-17 NOTE — Progress Notes (Signed)
Dg l 

## 2020-12-05 DIAGNOSIS — E782 Mixed hyperlipidemia: Secondary | ICD-10-CM | POA: Diagnosis not present

## 2020-12-05 DIAGNOSIS — I10 Essential (primary) hypertension: Secondary | ICD-10-CM | POA: Diagnosis not present

## 2020-12-05 DIAGNOSIS — M1990 Unspecified osteoarthritis, unspecified site: Secondary | ICD-10-CM | POA: Diagnosis not present

## 2021-01-05 ENCOUNTER — Other Ambulatory Visit: Payer: Self-pay

## 2021-01-05 ENCOUNTER — Ambulatory Visit: Payer: Medicare PPO | Admitting: Orthopedic Surgery

## 2021-01-05 VITALS — Ht 70.0 in | Wt 165.0 lb

## 2021-01-05 DIAGNOSIS — M25552 Pain in left hip: Secondary | ICD-10-CM

## 2021-01-05 DIAGNOSIS — S72142D Displaced intertrochanteric fracture of left femur, subsequent encounter for closed fracture with routine healing: Secondary | ICD-10-CM

## 2021-01-05 DIAGNOSIS — M47816 Spondylosis without myelopathy or radiculopathy, lumbar region: Secondary | ICD-10-CM

## 2021-01-05 DIAGNOSIS — Z8781 Personal history of (healed) traumatic fracture: Secondary | ICD-10-CM

## 2021-01-05 NOTE — Progress Notes (Signed)
Chief Complaint  Patient presents with   Follow-up    Recheck on back     Encounter Diagnoses  Name Primary?   Closed displaced intertrochanteric fracture of left femur with routine healing, subsequent encounter Yes   Lumbar spondylosis    Pain in left hip    Prospero comes in for follow-up he says he feels much better he is 90 to 95% pain-free still has some pain over the trochanter but is back to all normal activities  Is focused exam showed mild tenderness over the left greater trochanter normal range of motion of the left hip and no tenderness in the lumbar spine  You will follow-up as needed

## 2021-01-05 NOTE — Patient Instructions (Signed)
Take medication as needed  

## 2021-01-21 DIAGNOSIS — R051 Acute cough: Secondary | ICD-10-CM | POA: Diagnosis not present

## 2021-01-21 DIAGNOSIS — U071 COVID-19: Secondary | ICD-10-CM | POA: Diagnosis not present

## 2021-01-30 DIAGNOSIS — E1122 Type 2 diabetes mellitus with diabetic chronic kidney disease: Secondary | ICD-10-CM | POA: Diagnosis not present

## 2021-01-30 DIAGNOSIS — E782 Mixed hyperlipidemia: Secondary | ICD-10-CM | POA: Diagnosis not present

## 2021-02-05 DIAGNOSIS — R978 Other abnormal tumor markers: Secondary | ICD-10-CM | POA: Diagnosis not present

## 2021-02-05 DIAGNOSIS — E1122 Type 2 diabetes mellitus with diabetic chronic kidney disease: Secondary | ICD-10-CM | POA: Diagnosis not present

## 2021-02-05 DIAGNOSIS — H9193 Unspecified hearing loss, bilateral: Secondary | ICD-10-CM | POA: Diagnosis not present

## 2021-02-05 DIAGNOSIS — K219 Gastro-esophageal reflux disease without esophagitis: Secondary | ICD-10-CM | POA: Diagnosis not present

## 2021-02-05 DIAGNOSIS — E782 Mixed hyperlipidemia: Secondary | ICD-10-CM | POA: Diagnosis not present

## 2021-02-05 DIAGNOSIS — Z0001 Encounter for general adult medical examination with abnormal findings: Secondary | ICD-10-CM | POA: Diagnosis not present

## 2021-02-05 DIAGNOSIS — I10 Essential (primary) hypertension: Secondary | ICD-10-CM | POA: Diagnosis not present

## 2021-02-05 DIAGNOSIS — N182 Chronic kidney disease, stage 2 (mild): Secondary | ICD-10-CM | POA: Diagnosis not present

## 2021-02-05 DIAGNOSIS — E875 Hyperkalemia: Secondary | ICD-10-CM | POA: Diagnosis not present

## 2021-03-19 DIAGNOSIS — R972 Elevated prostate specific antigen [PSA]: Secondary | ICD-10-CM | POA: Diagnosis not present

## 2021-03-19 DIAGNOSIS — N138 Other obstructive and reflux uropathy: Secondary | ICD-10-CM | POA: Diagnosis not present

## 2021-03-19 DIAGNOSIS — N200 Calculus of kidney: Secondary | ICD-10-CM | POA: Diagnosis not present

## 2021-03-19 DIAGNOSIS — N401 Enlarged prostate with lower urinary tract symptoms: Secondary | ICD-10-CM | POA: Diagnosis not present

## 2021-03-19 DIAGNOSIS — N529 Male erectile dysfunction, unspecified: Secondary | ICD-10-CM | POA: Diagnosis not present

## 2021-04-06 DIAGNOSIS — H35371 Puckering of macula, right eye: Secondary | ICD-10-CM | POA: Diagnosis not present

## 2021-04-17 DIAGNOSIS — E1165 Type 2 diabetes mellitus with hyperglycemia: Secondary | ICD-10-CM | POA: Diagnosis not present

## 2021-04-17 DIAGNOSIS — I10 Essential (primary) hypertension: Secondary | ICD-10-CM | POA: Diagnosis not present

## 2021-04-27 DIAGNOSIS — R972 Elevated prostate specific antigen [PSA]: Secondary | ICD-10-CM | POA: Diagnosis not present

## 2021-05-08 DIAGNOSIS — E1122 Type 2 diabetes mellitus with diabetic chronic kidney disease: Secondary | ICD-10-CM | POA: Diagnosis not present

## 2021-05-08 DIAGNOSIS — E782 Mixed hyperlipidemia: Secondary | ICD-10-CM | POA: Diagnosis not present

## 2021-05-18 ENCOUNTER — Other Ambulatory Visit: Payer: Self-pay | Admitting: Internal Medicine

## 2021-05-18 DIAGNOSIS — E1122 Type 2 diabetes mellitus with diabetic chronic kidney disease: Secondary | ICD-10-CM | POA: Diagnosis not present

## 2021-05-18 DIAGNOSIS — I10 Essential (primary) hypertension: Secondary | ICD-10-CM | POA: Diagnosis not present

## 2021-05-18 DIAGNOSIS — R978 Other abnormal tumor markers: Secondary | ICD-10-CM | POA: Diagnosis not present

## 2021-05-18 DIAGNOSIS — K219 Gastro-esophageal reflux disease without esophagitis: Secondary | ICD-10-CM | POA: Diagnosis not present

## 2021-05-18 DIAGNOSIS — E782 Mixed hyperlipidemia: Secondary | ICD-10-CM | POA: Diagnosis not present

## 2021-05-18 DIAGNOSIS — H9193 Unspecified hearing loss, bilateral: Secondary | ICD-10-CM | POA: Diagnosis not present

## 2021-05-18 DIAGNOSIS — E875 Hyperkalemia: Secondary | ICD-10-CM | POA: Diagnosis not present

## 2021-05-18 DIAGNOSIS — N182 Chronic kidney disease, stage 2 (mild): Secondary | ICD-10-CM | POA: Diagnosis not present

## 2021-05-18 DIAGNOSIS — R195 Other fecal abnormalities: Secondary | ICD-10-CM | POA: Diagnosis not present

## 2021-07-17 DIAGNOSIS — E782 Mixed hyperlipidemia: Secondary | ICD-10-CM | POA: Diagnosis not present

## 2021-07-17 DIAGNOSIS — I10 Essential (primary) hypertension: Secondary | ICD-10-CM | POA: Diagnosis not present

## 2021-08-11 DIAGNOSIS — K625 Hemorrhage of anus and rectum: Secondary | ICD-10-CM | POA: Diagnosis not present

## 2021-08-13 ENCOUNTER — Encounter: Payer: Self-pay | Admitting: Internal Medicine

## 2021-08-31 DIAGNOSIS — H43393 Other vitreous opacities, bilateral: Secondary | ICD-10-CM | POA: Diagnosis not present

## 2021-09-14 DIAGNOSIS — E782 Mixed hyperlipidemia: Secondary | ICD-10-CM | POA: Diagnosis not present

## 2021-09-14 DIAGNOSIS — E1122 Type 2 diabetes mellitus with diabetic chronic kidney disease: Secondary | ICD-10-CM | POA: Diagnosis not present

## 2021-09-16 ENCOUNTER — Telehealth: Payer: Self-pay

## 2021-09-16 ENCOUNTER — Ambulatory Visit (INDEPENDENT_AMBULATORY_CARE_PROVIDER_SITE_OTHER): Payer: Medicare PPO | Admitting: Internal Medicine

## 2021-09-16 ENCOUNTER — Encounter: Payer: Self-pay | Admitting: Internal Medicine

## 2021-09-16 ENCOUNTER — Other Ambulatory Visit: Payer: Self-pay

## 2021-09-16 VITALS — BP 140/68 | HR 77 | Temp 98.0°F | Ht 70.0 in | Wt 177.2 lb

## 2021-09-16 DIAGNOSIS — K219 Gastro-esophageal reflux disease without esophagitis: Secondary | ICD-10-CM

## 2021-09-16 DIAGNOSIS — K625 Hemorrhage of anus and rectum: Secondary | ICD-10-CM | POA: Diagnosis not present

## 2021-09-16 MED ORDER — PEG 3350-KCL-NA BICARB-NACL 420 G PO SOLR: 4000.0000 mL | ORAL | 0 refills | Status: DC

## 2021-09-16 NOTE — H&P (View-Only) (Signed)
? ? ?Primary Care Physician:  Hall, John Z, MD ?Primary Gastroenterologist:  Dr. Avereigh Spainhower ? ?Chief Complaint  ?Patient presents with  ? Blood In Stools  ?  Passing blood back in Jan 22nd through Jan 27th stopped. None sense  ? ? ?HPI:   ?[name] is a 71 y.o. male who presents to the clinic today by referral from his PCP Dr. Hall for evaluation.  Patient state towards the end of January he had 3-day stretch of rectal bleeding with bowel movements.  Notes blood in the stool and toilet bowl.  No rectal pain or discomfort.  No rectal itching.  No abdominal pain.  States this resolved after 3 days and he has not had any bleeding since. ? ?Notes last colonoscopy many years ago.  Has used Cologuard for colon cancer screening twice.  States first 1 was negative.  His most recent 2 years ago was positive for blood but not DNA. ? ?No history of polyps.  No family history of colorectal malignancy. ? ?Does have chronic reflux as well which is well controlled on pantoprazole.  No dysphagia odynophagia.  No epigastric or chest pain. ? ?Past Medical History:  ?Diagnosis Date  ? DM2 (diabetes mellitus, type 2) ([facility])   ? HTN (hypertension)   ? ? ?Past Surgical History:  ?Procedure Laterality Date  ? HIP FRACTURE SURGERY Left   ? ? ?Current Outpatient Medications  ?Medication Sig Dispense Refill  ? aspirin EC 81 MG tablet Take 81 mg by mouth daily.    ? finasteride (PROSCAR) 5 MG tablet Take 5 mg by mouth daily. (Patient not taking: No sig reported)    ? fluticasone (FLONASE) 50 MCG/ACT nasal spray Place into both nostrils daily.    ? glipiZIDE (GLUCOTROL) 5 MG tablet     ? meloxicam (MOBIC) 7.5 MG tablet Take 1 tablet (7.5 mg total) by mouth daily. 14 tablet 0  ? meloxicam (MOBIC) 7.5 MG tablet Take 1 tablet (7.5 mg total) by mouth daily. 28 tablet 5  ? metFORMIN (GLUCOPHAGE) 1000 MG tablet     ? olmesartan (BENICAR) 40 MG tablet Take 40 mg by mouth daily.    ? rosuvastatin (CRESTOR) 10 MG tablet     ? tamsulosin (FLOMAX) 0.4  MG CAPS capsule     ? ?No current facility-administered medications for this visit.  ? ? ?Allergies as of 09/16/2021 - Review Complete 09/16/2021  ?Allergen Reaction Noted  ? Septra [sulfamethoxazole-trimethoprim]  11/17/2020  ? ? ?Family History  ?Problem Relation Age of Onset  ? Stroke Father   ? Diabetes Paternal Uncle   ? ? ?Social History  ? ?Socioeconomic History  ? Marital status: Married  ?  Spouse name: Not on file  ? Number of children: Not on file  ? Years of education: Not on file  ? Highest education level: Not on file  ?Occupational History  ? Not on file  ?Tobacco Use  ? Smoking status: Never  ? Smokeless tobacco: Never  ?Substance and Sexual Activity  ? Alcohol use: Yes  ?  Alcohol/week: 7.0 standard drinks  ?  Types: 7 Cans of beer per week  ? Drug use: Not on file  ? Sexual activity: Not on file  ?Other Topics Concern  ? Not on file  ?Social History Narrative  ? Not on file  ? ?Social Determinants of Health  ? ?Financial Resource Strain: Not on file  ?Food Insecurity: Not on file  ?Transportation Needs: Not on file  ?Physical Activity:   Not on file  ?Stress: Not on file  ?Social Connections: Not on file  ?Intimate Partner Violence: Not on file  ? ? ?Subjective: ?Review of Systems  ?Constitutional:  Negative for chills and fever.  ?HENT:  Negative for congestion and hearing loss.   ?Eyes:  Negative for blurred vision and double vision.  ?Respiratory:  Negative for cough and shortness of breath.   ?Cardiovascular:  Negative for chest pain and palpitations.  ?Gastrointestinal:  Positive for blood in stool and heartburn. Negative for abdominal pain, constipation, diarrhea, melena and vomiting.  ?Genitourinary:  Negative for dysuria and urgency.  ?Musculoskeletal:  Negative for joint pain and myalgias.  ?Skin:  Negative for itching and rash.  ?Neurological:  Negative for dizziness and headaches.  ?Psychiatric/Behavioral:  Negative for depression. The patient is not nervous/anxious.     ? ? ? ?Objective: ?BP 140/68   Pulse 77   Temp 98 ?F (36.7 ?C)   Ht 5' 10" (1.778 m)   Wt 177 lb 3.2 oz (80.4 kg)   BMI 25.43 kg/m?  ?Physical Exam ?Constitutional:   ?   Appearance: Normal appearance.  ?HENT:  ?   Head: Normocephalic and atraumatic.  ?Eyes:  ?   Extraocular Movements: Extraocular movements intact.  ?   Conjunctiva/sclera: Conjunctivae normal.  ?Cardiovascular:  ?   Rate and Rhythm: Normal rate and regular rhythm.  ?Pulmonary:  ?   Effort: Pulmonary effort is normal.  ?   Breath sounds: Normal breath sounds.  ?Abdominal:  ?   General: Bowel sounds are normal.  ?   Palpations: Abdomen is soft.  ?Musculoskeletal:     ?   General: Normal range of motion.  ?   Cervical back: Normal range of motion and neck supple.  ?Skin: ?   General: Skin is warm.  ?Neurological:  ?   General: No focal deficit present.  ?   Mental Status: He is alert and oriented to person, place, and time.  ?Psychiatric:     ?   Mood and Affect: Mood normal.     ?   Behavior: Behavior normal.  ? ? ? ?Assessment: ?*Rectal bleeding ?*GERD-well-controlled on pantoprazole ? ?Plan: ?Discussed rectal bleeding With patient today.  Likely due to internal hemorrhoids.  However given that his been over 10 years since his most recent colonoscopy, would recommend colonoscopy to further evaluate.  He is in agreement.  Differential includes hemorrhoids, diverticular, AVMs, polyps, malignancy, or other. ? ?The risks including infection, bleed, or perforation as well as benefits, limitations, alternatives and imponderables have been reviewed with the patient. Questions have been answered. All parties agreeable. ? ?GERD well-controlled on pantoprazole.  No alarm symptoms today to warrant EGD.  We will continue on pantoprazole. ? ?Thank you Dr. Hall for the kind referral ?09/16/2021 3:51 PM ? ? ?Disclaimer: This note was dictated with voice recognition software. Similar sounding words can inadvertently be transcribed and may not be corrected upon  review. ? ?

## 2021-09-16 NOTE — Telephone Encounter (Signed)
PA for TCS submitted via [employer] website. [employer]# [id number], valid 10/06/21-01/04/22. ?

## 2021-09-16 NOTE — Progress Notes (Signed)
? ? ?Primary Care Physician:  Celene Squibb, MD ?Primary Gastroenterologist:  Dr. Abbey Chatters ? ?Chief Complaint  ?Patient presents with  ? Blood In Stools  ?  Passing blood back in Jan 22nd through Jan 27th stopped. None sense  ? ? ?HPI:   ?[name] is a 71 y.o. male who presents to the clinic today by referral from his PCP Dr. Nevada Crane for evaluation.  Patient state towards the end of January he had 3-day stretch of rectal bleeding with bowel movements.  Notes blood in the stool and toilet bowl.  No rectal pain or discomfort.  No rectal itching.  No abdominal pain.  States this resolved after 3 days and he has not had any bleeding since. ? ?Notes last colonoscopy many years ago.  Has used Cologuard for colon cancer screening twice.  States first 1 was negative.  His most recent 2 years ago was positive for blood but not DNA. ? ?No history of polyps.  No family history of colorectal malignancy. ? ?Does have chronic reflux as well which is well controlled on pantoprazole.  No dysphagia odynophagia.  No epigastric or chest pain. ? ?Past Medical History:  ?Diagnosis Date  ? DM2 (diabetes mellitus, type 2) ([facility])   ? HTN (hypertension)   ? ? ?Past Surgical History:  ?Procedure Laterality Date  ? HIP FRACTURE SURGERY Left   ? ? ?Current Outpatient Medications  ?Medication Sig Dispense Refill  ? aspirin EC 81 MG tablet Take 81 mg by mouth daily.    ? finasteride (PROSCAR) 5 MG tablet Take 5 mg by mouth daily. (Patient not taking: No sig reported)    ? fluticasone (FLONASE) 50 MCG/ACT nasal spray Place into both nostrils daily.    ? glipiZIDE (GLUCOTROL) 5 MG tablet     ? meloxicam (MOBIC) 7.5 MG tablet Take 1 tablet (7.5 mg total) by mouth daily. 14 tablet 0  ? meloxicam (MOBIC) 7.5 MG tablet Take 1 tablet (7.5 mg total) by mouth daily. 28 tablet 5  ? metFORMIN (GLUCOPHAGE) 1000 MG tablet     ? olmesartan (BENICAR) 40 MG tablet Take 40 mg by mouth daily.    ? rosuvastatin (CRESTOR) 10 MG tablet     ? tamsulosin (FLOMAX) 0.4  MG CAPS capsule     ? ?No current facility-administered medications for this visit.  ? ? ?Allergies as of 09/16/2021 - Review Complete 09/16/2021  ?Allergen Reaction Noted  ? Septra [sulfamethoxazole-trimethoprim]  11/17/2020  ? ? ?Family History  ?Problem Relation Age of Onset  ? Stroke Father   ? Diabetes Paternal Uncle   ? ? ?Social History  ? ?Socioeconomic History  ? Marital status: Married  ?  Spouse name: Not on file  ? Number of children: Not on file  ? Years of education: Not on file  ? Highest education level: Not on file  ?Occupational History  ? Not on file  ?Tobacco Use  ? Smoking status: Never  ? Smokeless tobacco: Never  ?Substance and Sexual Activity  ? Alcohol use: Yes  ?  Alcohol/week: 7.0 standard drinks  ?  Types: 7 Cans of beer per week  ? Drug use: Not on file  ? Sexual activity: Not on file  ?Other Topics Concern  ? Not on file  ?Social History Narrative  ? Not on file  ? ?Social Determinants of Health  ? ?Financial Resource Strain: Not on file  ?Food Insecurity: Not on file  ?Transportation Needs: Not on file  ?Physical Activity:  Not on file  ?Stress: Not on file  ?Social Connections: Not on file  ?Intimate Partner Violence: Not on file  ? ? ?Subjective: ?Review of Systems  ?Constitutional:  Negative for chills and fever.  ?HENT:  Negative for congestion and hearing loss.   ?Eyes:  Negative for blurred vision and double vision.  ?Respiratory:  Negative for cough and shortness of breath.   ?Cardiovascular:  Negative for chest pain and palpitations.  ?Gastrointestinal:  Positive for blood in stool and heartburn. Negative for abdominal pain, constipation, diarrhea, melena and vomiting.  ?Genitourinary:  Negative for dysuria and urgency.  ?Musculoskeletal:  Negative for joint pain and myalgias.  ?Skin:  Negative for itching and rash.  ?Neurological:  Negative for dizziness and headaches.  ?Psychiatric/Behavioral:  Negative for depression. The patient is not nervous/anxious.     ? ? ? ?Objective: ?BP 140/68   Pulse 77   Temp 98 ?F (36.7 ?C)   Ht 5\' 10"  (1.778 m)   Wt 177 lb 3.2 oz (80.4 kg)   BMI 25.43 kg/m?  ?Physical Exam ?Constitutional:   ?   Appearance: Normal appearance.  ?HENT:  ?   Head: Normocephalic and atraumatic.  ?Eyes:  ?   Extraocular Movements: Extraocular movements intact.  ?   Conjunctiva/sclera: Conjunctivae normal.  ?Cardiovascular:  ?   Rate and Rhythm: Normal rate and regular rhythm.  ?Pulmonary:  ?   Effort: Pulmonary effort is normal.  ?   Breath sounds: Normal breath sounds.  ?Abdominal:  ?   General: Bowel sounds are normal.  ?   Palpations: Abdomen is soft.  ?Musculoskeletal:     ?   General: Normal range of motion.  ?   Cervical back: Normal range of motion and neck supple.  ?Skin: ?   General: Skin is warm.  ?Neurological:  ?   General: No focal deficit present.  ?   Mental Status: He is alert and oriented to person, place, and time.  ?Psychiatric:     ?   Mood and Affect: Mood normal.     ?   Behavior: Behavior normal.  ? ? ? ?Assessment: ?*Rectal bleeding ?*GERD-well-controlled on pantoprazole ? ?Plan: ?Discussed rectal bleeding With patient today.  Likely due to internal hemorrhoids.  However given that his been over 10 years since his most recent colonoscopy, would recommend colonoscopy to further evaluate.  He is in agreement.  Differential includes hemorrhoids, diverticular, AVMs, polyps, malignancy, or other. ? ?The risks including infection, bleed, or perforation as well as benefits, limitations, alternatives and imponderables have been reviewed with the patient. Questions have been answered. All parties agreeable. ? ?GERD well-controlled on pantoprazole.  No alarm symptoms today to warrant EGD.  We will continue on pantoprazole. ? ?Thank you Dr. Nevada Crane for the kind referral ?09/16/2021 3:51 PM ? ? ?Disclaimer: This note was dictated with voice recognition software. Similar sounding words can inadvertently be transcribed and may not be corrected upon  review. ? ?

## 2021-09-16 NOTE — Patient Instructions (Signed)
We will schedule you for colonoscopy to further evaluate your rectal bleeding.  Likely this is due to internal hemorrhoids.  If that is the case, we can consider hemorrhoid banding in the clinic. ? ?It was very nice meeting you today. ? ?Dr. Marletta Lor ? ?At [facility] we value your feedback. You may receive a survey about your visit today. Please share your experience as we strive to create trusting relationships with our patients to provide genuine, compassionate, quality care. ? ?We appreciate your understanding and patience as we review any laboratory studies, imaging, and other diagnostic tests that are ordered as we care for you. Our office policy is 5 business days for review of these results, and any emergent or urgent results are addressed in a timely manner for your best interest. If you do not hear from our office in 1 week, please contact us.  ? ?We also encourage the use of MyChart, which contains your medical information for your review as well. If you are not enrolled in this feature, an access code is on this after visit summary for your convenience. Thank you for allowing [place] to be involved in your care. ? ?It was great to see you today!  I hope you have a great rest of your Winter! ? ? ? ?Hennie Duos. Marletta Lor, D.O. ?Gastroenterology and Hepatology ?[facility] ? ?

## 2021-09-18 DIAGNOSIS — E782 Mixed hyperlipidemia: Secondary | ICD-10-CM | POA: Diagnosis not present

## 2021-09-18 DIAGNOSIS — I10 Essential (primary) hypertension: Secondary | ICD-10-CM | POA: Diagnosis not present

## 2021-09-18 DIAGNOSIS — E1122 Type 2 diabetes mellitus with diabetic chronic kidney disease: Secondary | ICD-10-CM | POA: Diagnosis not present

## 2021-09-18 DIAGNOSIS — R978 Other abnormal tumor markers: Secondary | ICD-10-CM | POA: Diagnosis not present

## 2021-09-18 DIAGNOSIS — R195 Other fecal abnormalities: Secondary | ICD-10-CM | POA: Diagnosis not present

## 2021-10-06 ENCOUNTER — Encounter (HOSPITAL_COMMUNITY)
Admission: RE | Disposition: A | Discharge status: Home / Self Care | Payer: Self-pay | Source: Home / Self Care | Attending: Internal Medicine

## 2021-10-06 ENCOUNTER — Encounter (HOSPITAL_COMMUNITY): Payer: Self-pay

## 2021-10-06 ENCOUNTER — Ambulatory Visit (HOSPITAL_BASED_OUTPATIENT_CLINIC_OR_DEPARTMENT_OTHER): Payer: Medicare PPO | Admitting: Anesthesiology

## 2021-10-06 ENCOUNTER — Other Ambulatory Visit: Payer: Self-pay

## 2021-10-06 ENCOUNTER — Ambulatory Visit (HOSPITAL_COMMUNITY): Payer: Medicare PPO | Admitting: Anesthesiology

## 2021-10-06 ENCOUNTER — Ambulatory Visit (HOSPITAL_COMMUNITY)
Admission: RE | Admit: 2021-10-06 | Discharge: 2021-10-06 | Disposition: A | Discharge status: Home / Self Care | Payer: Medicare PPO | Attending: Internal Medicine | Admitting: Internal Medicine

## 2021-10-06 DIAGNOSIS — K5730 Diverticulosis of large intestine without perforation or abscess without bleeding: Secondary | ICD-10-CM | POA: Diagnosis not present

## 2021-10-06 DIAGNOSIS — K5731 Diverticulosis of large intestine without perforation or abscess with bleeding: Secondary | ICD-10-CM

## 2021-10-06 DIAGNOSIS — E119 Type 2 diabetes mellitus without complications: Secondary | ICD-10-CM | POA: Diagnosis not present

## 2021-10-06 DIAGNOSIS — Z79899 Other long term (current) drug therapy: Secondary | ICD-10-CM | POA: Diagnosis not present

## 2021-10-06 DIAGNOSIS — K921 Melena: Secondary | ICD-10-CM | POA: Diagnosis not present

## 2021-10-06 DIAGNOSIS — K219 Gastro-esophageal reflux disease without esophagitis: Secondary | ICD-10-CM | POA: Insufficient documentation

## 2021-10-06 DIAGNOSIS — K648 Other hemorrhoids: Secondary | ICD-10-CM

## 2021-10-06 HISTORY — PX: FLEXIBLE SIGMOIDOSCOPY: SHX5431

## 2021-10-06 LAB — GLUCOSE, CAPILLARY: Glucose-Capillary: 107 mg/dL — ABNORMAL HIGH (ref 70–99)

## 2021-10-06 SURGERY — SIGMOIDOSCOPY, FLEXIBLE
Anesthesia: General

## 2021-10-06 MED ORDER — PROPOFOL 10 MG/ML IV BOLUS
INTRAVENOUS | Status: DC | PRN
  Administered 2021-10-06: 100 mg via INTRAVENOUS

## 2021-10-06 MED ORDER — LACTATED RINGERS IV SOLN: INTRAVENOUS | Status: DC

## 2021-10-06 MED ORDER — PROPOFOL 500 MG/50ML IV EMUL
INTRAVENOUS | Status: DC | PRN
  Administered 2021-10-06: 150 ug/kg/min via INTRAVENOUS

## 2021-10-06 MED ORDER — LIDOCAINE HCL (CARDIAC) PF 50 MG/5ML IV SOSY
PREFILLED_SYRINGE | INTRAVENOUS | Status: DC | PRN
  Administered 2021-10-06: 50 mg via INTRAVENOUS

## 2021-10-06 NOTE — Anesthesia Preprocedure Evaluation (Signed)
Anesthesia Evaluation  ?Patient identified by MRN, date of birth, ID band ?Patient awake ? ? ? ?Reviewed: ?Allergy & Precautions, NPO status , Patient's Chart, lab work & pertinent test results ? ?Airway ?Mallampati: II ? ?TM Distance: >3 FB ?Neck ROM: Full ? ? ? Dental ? ?(+) Dental Advisory Given, Teeth Intact ?  ?Pulmonary ?Current Smoker (occasional cigar) and Patient abstained from smoking.,  ?  ?Pulmonary exam normal ?breath sounds clear to auscultation ? ? ? ? ? ? Cardiovascular ?hypertension, Pt. on medications ?Normal cardiovascular exam ?Rhythm:Regular Rate:Normal ? ? ?  ?Neuro/Psych ?negative neurological ROS ? negative psych ROS  ? GI/Hepatic ?Neg liver ROS, GERD  Medicated and Controlled,  ?Endo/Other  ?diabetes, Well Controlled, Type 2, Oral Hypoglycemic Agents ? Renal/GU ?negative Renal ROS  ?negative genitourinary ?  ?Musculoskeletal ?negative musculoskeletal ROS ?(+)  ? Abdominal ?  ?Peds ?negative pediatric ROS ?(+)  Hematology ?negative hematology ROS ?(+)   ?Anesthesia Other Findings ? ? Reproductive/Obstetrics ?negative OB ROS ? ?  ? ? ? ? ? ? ? ? ? ? ? ? ? ?  ?  ? ? ? ? ? ? ? ? ?Anesthesia Physical ?Anesthesia Plan ? ?ASA: 2 ? ?Anesthesia Plan: General  ? ?Post-op Pain Management: Minimal or no pain anticipated  ? ?Induction: Intravenous ? ?PONV Risk Score and Plan: TIVA ? ?Airway Management Planned: Nasal Cannula and Natural Airway ? ?Additional Equipment:  ? ?Intra-op Plan:  ? ?Post-operative Plan:  ? ?Informed Consent: I have reviewed the patients History and Physical, chart, labs and discussed the procedure including the risks, benefits and alternatives for the proposed anesthesia with the patient or authorized representative who has indicated his/her understanding and acceptance.  ? ? ? ? ? ?Plan Discussed with: CRNA and Surgeon ? ?Anesthesia Plan Comments:   ? ? ? ? ? ? ?Anesthesia Quick Evaluation ? ?

## 2021-10-06 NOTE — Transfer of Care (Signed)
Immediate Anesthesia Transfer of Care Note ? ?Patient: [name] ? ?Procedure(s) Performed: FLEXIBLE SIGMOIDOSCOPY ? ?Patient Location: Endoscopy Unit ? ?Anesthesia Type:General ? ?Level of Consciousness: drowsy ? ?Airway & Oxygen Therapy: Patient Spontanous Breathing ? ?Post-op Assessment: Report given to RN and Post -op Vital signs reviewed and stable ? ?Post vital signs: Reviewed and stable ? ?Last Vitals:  ?Vitals Value Taken Time  ?BP    ?Temp    ?Pulse 56   ?Resp 20   ?SpO2 96%   ? ? ?Last Pain:  ?Vitals:  ? 10/06/21 1202  ?TempSrc: Oral  ?PainSc: 0-No pain  ?   ? ?Patients Stated Pain Goal: 7 (10/06/21 1045) ? ?Complications: No notable events documented. ?

## 2021-10-06 NOTE — Interval H&P Note (Signed)
History and Physical Interval Note: ? ?10/06/2021 ?11:54 AM ? ?[name]  has presented today for surgery, with the diagnosis of rectal bleeding.  The various methods of treatment have been discussed with the patient and family. After consideration of risks, benefits and other options for treatment, the patient has consented to  Procedure(s) with comments: ?COLONOSCOPY WITH PROPOFOL (N/A) - 11:30am as a surgical intervention.  The patient's history has been reviewed, patient examined, no change in status, stable for surgery.  I have reviewed the patient's chart and labs.  Questions were answered to the patient's satisfaction.   ? ? ?Lanelle Bal ? ? ?

## 2021-10-06 NOTE — Discharge Instructions (Addendum)
?  Colonoscopy ?Discharge Instructions ? ?Read the instructions outlined below and refer to this sheet in the next few weeks. These discharge instructions provide you with general information on caring for yourself after you leave the hospital. Your doctor may also give you specific instructions. While your treatment has been planned according to the most current medical practices available, unavoidable complications occasionally occur.  ? ?ACTIVITY ?You may resume your regular activity, but move at a slower pace for the next 24 hours.  ?Take frequent rest periods for the next 24 hours.  ?Walking will help get rid of the air and reduce the bloated feeling in your belly (abdomen).  ?No driving for 24 hours (because of the medicine (anesthesia) used during the test).   ?Do not sign any important legal documents or operate any machinery for 24 hours (because of the anesthesia used during the test).  ?NUTRITION ?Drink plenty of fluids.  ?You may resume your normal diet as instructed by your doctor.  ?Begin with a light meal and progress to your normal diet. Heavy or fried foods are harder to digest and may make you feel sick to your stomach (nauseated).  ?Avoid alcoholic beverages for 24 hours or as instructed.  ?MEDICATIONS ?You may resume your normal medications unless your doctor tells you otherwise.  ?WHAT YOU CAN EXPECT TODAY ?Some feelings of bloating in the abdomen.  ?Passage of more gas than usual.  ?Spotting of blood in your stool or on the toilet paper.  ?IF YOU HAD POLYPS REMOVED DURING THE COLONOSCOPY: ?No aspirin products for 7 days or as instructed.  ?No alcohol for 7 days or as instructed.  ?Eat a soft diet for the next 24 hours.  ?FINDING OUT THE RESULTS OF YOUR TEST ?Not all test results are available during your visit. If your test results are not back during the visit, make an appointment with your caregiver to find out the results. Do not assume everything is normal if you have not heard from your  caregiver or the medical facility. It is important for you to follow up on all of your test results.  ?SEEK IMMEDIATE MEDICAL ATTENTION IF: ?You have more than a spotting of blood in your stool.  ?Your belly is swollen (abdominal distention).  ?You are nauseated or vomiting.  ?You have a temperature over 101.  ?You have abdominal pain or discomfort that is severe or gets worse throughout the day.  ? ?You have significant diverticular disease in the left side your colon.  This has caused your colon to be very tortuous with many tight turns.  I was unable to advance colonoscope safely past this region even after switching to an ultraslim scope.  You do have internal hemorrhoids and diverticulosis as stated above.  Would recommend virtual colonoscopy to further evaluate the middle and right side your colon versus referral to tertiary care center for repeat colonoscopy. ? ?I hope you have a great rest of your week! ? ?Hennie Duos. Marletta Lor, D.O. ?Gastroenterology and Hepatology ?[facility] ? ?

## 2021-10-06 NOTE — Anesthesia Postprocedure Evaluation (Signed)
Anesthesia Post Note ? ?Patient: [name] ? ?Procedure(s) Performed: FLEXIBLE SIGMOIDOSCOPY ? ?Patient location during evaluation: Phase II ?Anesthesia Type: General ?Level of consciousness: awake and alert and oriented ?Pain management: pain level controlled ?Vital Signs Assessment: post-procedure vital signs reviewed and stable ?Respiratory status: spontaneous breathing, nonlabored ventilation and respiratory function stable ?Cardiovascular status: blood pressure returned to baseline and stable ?Postop Assessment: no apparent nausea or vomiting ?Anesthetic complications: no ? ? ?No notable events documented. ? ? ?Last Vitals:  ?Vitals:  ? 10/06/21 1237 10/06/21 1247  ?BP: 115/63 116/69  ?Pulse: (!) 58   ?Resp: 19   ?Temp: 36.5 ?C   ?SpO2: 96%   ?  ?Last Pain:  ?Vitals:  ? 10/06/21 1237  ?TempSrc: Axillary  ?PainSc: 0-No pain  ? ? ?  ?  ?  ?  ?  ?  ? ?Jahnia Hewes C Harley Fitzwater ? ? ? ? ?

## 2021-10-06 NOTE — Op Note (Signed)
[facility] ?Patient Name: [name] ?Procedure Date: 10/06/2021 11:52 AM ?MRN: [id number] ?Date of Birth: [birth year] ?Attending MD: Elon Alas. Abbey Chatters , DO ?CSN: [id number] ?Age: 71 ?Admit Type: Outpatient ?Procedure:                Colonoscopy ?Indications:              Hematochezia ?Providers:                Elon Alas. Abbey Chatters, DO, Janeece Riggers, RN, Kristine L.  ?                          Risa Grill, Technician ?Referring MD:              ?Medicines:                See the Anesthesia note for documentation of the  ?                          administered medications ?Complications:            No immediate complications. ?Estimated Blood Loss:     Estimated blood loss: none. ?Procedure:                Pre-Anesthesia Assessment: ?                          - The anesthesia plan was to use monitored  ?                          anesthesia care (MAC). ?                          After obtaining informed consent, the colonoscope  ?                          was passed under direct vision. Throughout the  ?                          procedure, the patient's blood pressure, pulse, and  ?                          oxygen saturations were monitored continuously. The  ?                          PCF-HQ190L ([id number]) scope was introduced through  ?                          the anus with the intention of advancing to the  ?                          cecum. The scope was advanced to the descending  ?                          colon before the procedure was aborted. Medications  ?                          were given. The colonoscopy was performed with  ?  difficulty due to unusual anatomy and restricted  ?                          mobility of the colon. The patient tolerated the  ?                          procedure well. The quality of the bowel  ?                          preparation was evaluated using the BBPS [facility]  ?                          Bowel Preparation Scale) with scores of: Left Colon  ?                           = 3 (entire mucosa seen well with no residual  ?                          staining, small fragments of stool or opaque  ?                          liquid). The total BBPS score equals 3. The quality  ?                          of the bowel preparation was excellent. ?Scope In: 12:03:11 PM ?Scope Out: 12:33:07 PM ?Total Procedure Duration: 0 hours 29 minutes 56 seconds  ?Findings: ?     The perianal and digital rectal examinations were normal. ?     Non-bleeding internal hemorrhoids were found during endoscopy. ?     Many small and large-mouthed diverticula were found in the sigmoid colon  ?     and descending colon. Colon tortuous in this area. Multiple tight turns.  ?     Unable to advance pediatric colonoscope. I then switched to ultra-slim  ?     colonoscope. Again unable to adfvance scope. Procedure was then aborted. ?Impression:               - Non-bleeding internal hemorrhoids. ?                          - Diverticulosis in the sigmoid colon and in the  ?                          descending colon. ?                          - No specimens collected. ?Moderate Sedation: ?     Per Anesthesia Care ?Recommendation:           - Patient has a contact number available for  ?                          emergencies. The signs and symptoms of potential  ?                          delayed complications were  discussed with the  ?                          patient. Return to normal activities tomorrow.  ?                          Written discharge instructions were provided to the  ?                          patient. ?                          - Resume previous diet. ?                          - Continue present medications. ?                          - Consider virtual colonoscopy vs referral to  ?                          tertiary care center for repeat colonoscopy. ?Procedure Code(s):        --- Professional --- ?                          [phone], 53, Colonoscopy, flexible; diagnostic,  ?                           including collection of specimen(s) by brushing or  ?                          washing, when performed (separate procedure) ?Diagnosis Code(s):        --- Professional --- ?                          O24.2, Other hemorrhoids ?                          K92.1, Melena (includes Hematochezia) ?                          K57.30, Diverticulosis of large intestine without  ?                          perforation or abscess without bleeding ?CPT copyright 2019 American Medical Association. All rights reserved. ?The codes documented in this report are preliminary and upon coder review may  ?be revised to meet current compliance requirements. ?Elon Alas. Abbey Chatters, DO ?Elon Alas. Abbey Chatters, DO ?10/06/2021 12:38:19 PM ?This report has been signed electronically. ?Number of Addenda: 0 ?

## 2021-10-08 ENCOUNTER — Encounter (HOSPITAL_COMMUNITY): Payer: Self-pay | Admitting: Internal Medicine

## 2021-11-19 DIAGNOSIS — N401 Enlarged prostate with lower urinary tract symptoms: Secondary | ICD-10-CM | POA: Diagnosis not present

## 2021-11-19 DIAGNOSIS — R972 Elevated prostate specific antigen [PSA]: Secondary | ICD-10-CM | POA: Diagnosis not present

## 2021-11-19 DIAGNOSIS — N529 Male erectile dysfunction, unspecified: Secondary | ICD-10-CM | POA: Diagnosis not present

## 2021-11-19 DIAGNOSIS — N200 Calculus of kidney: Secondary | ICD-10-CM | POA: Diagnosis not present

## 2021-11-19 DIAGNOSIS — N138 Other obstructive and reflux uropathy: Secondary | ICD-10-CM | POA: Diagnosis not present

## 2021-11-26 DIAGNOSIS — N400 Enlarged prostate without lower urinary tract symptoms: Secondary | ICD-10-CM | POA: Diagnosis not present

## 2021-11-26 DIAGNOSIS — N320 Bladder-neck obstruction: Secondary | ICD-10-CM | POA: Diagnosis not present

## 2021-11-26 DIAGNOSIS — K5730 Diverticulosis of large intestine without perforation or abscess without bleeding: Secondary | ICD-10-CM | POA: Diagnosis not present

## 2021-11-26 DIAGNOSIS — N210 Calculus in bladder: Secondary | ICD-10-CM | POA: Diagnosis not present

## 2021-11-26 DIAGNOSIS — N200 Calculus of kidney: Secondary | ICD-10-CM | POA: Diagnosis not present

## 2021-11-26 DIAGNOSIS — N281 Cyst of kidney, acquired: Secondary | ICD-10-CM | POA: Diagnosis not present

## 2021-12-25 DIAGNOSIS — Z125 Encounter for screening for malignant neoplasm of prostate: Secondary | ICD-10-CM | POA: Diagnosis not present

## 2021-12-25 DIAGNOSIS — E782 Mixed hyperlipidemia: Secondary | ICD-10-CM | POA: Diagnosis not present

## 2021-12-25 DIAGNOSIS — E1122 Type 2 diabetes mellitus with diabetic chronic kidney disease: Secondary | ICD-10-CM | POA: Diagnosis not present

## 2021-12-25 DIAGNOSIS — R978 Other abnormal tumor markers: Secondary | ICD-10-CM | POA: Diagnosis not present

## 2021-12-29 DIAGNOSIS — E1122 Type 2 diabetes mellitus with diabetic chronic kidney disease: Secondary | ICD-10-CM | POA: Diagnosis not present

## 2021-12-29 DIAGNOSIS — R978 Other abnormal tumor markers: Secondary | ICD-10-CM | POA: Diagnosis not present

## 2021-12-29 DIAGNOSIS — I10 Essential (primary) hypertension: Secondary | ICD-10-CM | POA: Diagnosis not present

## 2021-12-29 DIAGNOSIS — R195 Other fecal abnormalities: Secondary | ICD-10-CM | POA: Diagnosis not present

## 2021-12-29 DIAGNOSIS — E782 Mixed hyperlipidemia: Secondary | ICD-10-CM | POA: Diagnosis not present

## 2022-01-20 DIAGNOSIS — N210 Calculus in bladder: Secondary | ICD-10-CM | POA: Diagnosis not present

## 2022-01-20 DIAGNOSIS — H8110 Benign paroxysmal vertigo, unspecified ear: Secondary | ICD-10-CM | POA: Diagnosis not present

## 2022-02-02 DIAGNOSIS — E1165 Type 2 diabetes mellitus with hyperglycemia: Secondary | ICD-10-CM | POA: Diagnosis not present

## 2022-02-02 DIAGNOSIS — N400 Enlarged prostate without lower urinary tract symptoms: Secondary | ICD-10-CM | POA: Diagnosis not present

## 2022-02-02 DIAGNOSIS — N401 Enlarged prostate with lower urinary tract symptoms: Secondary | ICD-10-CM | POA: Diagnosis not present

## 2022-02-02 DIAGNOSIS — N210 Calculus in bladder: Secondary | ICD-10-CM | POA: Diagnosis not present

## 2022-02-02 DIAGNOSIS — N138 Other obstructive and reflux uropathy: Secondary | ICD-10-CM | POA: Diagnosis not present

## 2022-02-02 DIAGNOSIS — N411 Chronic prostatitis: Secondary | ICD-10-CM | POA: Diagnosis not present

## 2022-02-03 DIAGNOSIS — N401 Enlarged prostate with lower urinary tract symptoms: Secondary | ICD-10-CM | POA: Diagnosis not present

## 2022-02-03 DIAGNOSIS — E1165 Type 2 diabetes mellitus with hyperglycemia: Secondary | ICD-10-CM | POA: Diagnosis not present

## 2022-02-03 DIAGNOSIS — N138 Other obstructive and reflux uropathy: Secondary | ICD-10-CM | POA: Diagnosis not present

## 2022-02-03 DIAGNOSIS — N210 Calculus in bladder: Secondary | ICD-10-CM | POA: Diagnosis not present

## 2022-03-03 DIAGNOSIS — N138 Other obstructive and reflux uropathy: Secondary | ICD-10-CM | POA: Diagnosis not present

## 2022-03-03 DIAGNOSIS — Z09 Encounter for follow-up examination after completed treatment for conditions other than malignant neoplasm: Secondary | ICD-10-CM | POA: Diagnosis not present

## 2022-03-03 DIAGNOSIS — N210 Calculus in bladder: Secondary | ICD-10-CM | POA: Diagnosis not present

## 2022-03-03 DIAGNOSIS — N200 Calculus of kidney: Secondary | ICD-10-CM | POA: Diagnosis not present

## 2022-03-03 DIAGNOSIS — R972 Elevated prostate specific antigen [PSA]: Secondary | ICD-10-CM | POA: Diagnosis not present

## 2022-03-03 DIAGNOSIS — N401 Enlarged prostate with lower urinary tract symptoms: Secondary | ICD-10-CM | POA: Diagnosis not present

## 2022-03-03 DIAGNOSIS — N281 Cyst of kidney, acquired: Secondary | ICD-10-CM | POA: Diagnosis not present

## 2022-03-31 DIAGNOSIS — E1122 Type 2 diabetes mellitus with diabetic chronic kidney disease: Secondary | ICD-10-CM | POA: Diagnosis not present

## 2022-03-31 DIAGNOSIS — R978 Other abnormal tumor markers: Secondary | ICD-10-CM | POA: Diagnosis not present

## 2022-03-31 DIAGNOSIS — E782 Mixed hyperlipidemia: Secondary | ICD-10-CM | POA: Diagnosis not present

## 2022-04-01 DIAGNOSIS — H35371 Puckering of macula, right eye: Secondary | ICD-10-CM | POA: Diagnosis not present

## 2022-04-07 DIAGNOSIS — R978 Other abnormal tumor markers: Secondary | ICD-10-CM | POA: Diagnosis not present

## 2022-04-07 DIAGNOSIS — R195 Other fecal abnormalities: Secondary | ICD-10-CM | POA: Diagnosis not present

## 2022-04-07 DIAGNOSIS — I10 Essential (primary) hypertension: Secondary | ICD-10-CM | POA: Diagnosis not present

## 2022-04-07 DIAGNOSIS — Z0001 Encounter for general adult medical examination with abnormal findings: Secondary | ICD-10-CM | POA: Diagnosis not present

## 2022-04-07 DIAGNOSIS — K219 Gastro-esophageal reflux disease without esophagitis: Secondary | ICD-10-CM | POA: Diagnosis not present

## 2022-04-07 DIAGNOSIS — Z1211 Encounter for screening for malignant neoplasm of colon: Secondary | ICD-10-CM | POA: Diagnosis not present

## 2022-04-07 DIAGNOSIS — E782 Mixed hyperlipidemia: Secondary | ICD-10-CM | POA: Diagnosis not present

## 2022-04-07 DIAGNOSIS — Z23 Encounter for immunization: Secondary | ICD-10-CM | POA: Diagnosis not present

## 2022-04-07 DIAGNOSIS — E1165 Type 2 diabetes mellitus with hyperglycemia: Secondary | ICD-10-CM | POA: Diagnosis not present

## 2022-06-17 DIAGNOSIS — N529 Male erectile dysfunction, unspecified: Secondary | ICD-10-CM | POA: Diagnosis not present

## 2022-06-17 DIAGNOSIS — N138 Other obstructive and reflux uropathy: Secondary | ICD-10-CM | POA: Diagnosis not present

## 2022-06-17 DIAGNOSIS — R972 Elevated prostate specific antigen [PSA]: Secondary | ICD-10-CM | POA: Diagnosis not present

## 2022-06-17 DIAGNOSIS — N401 Enlarged prostate with lower urinary tract symptoms: Secondary | ICD-10-CM | POA: Diagnosis not present

## 2022-06-17 DIAGNOSIS — N200 Calculus of kidney: Secondary | ICD-10-CM | POA: Diagnosis not present

## 2022-06-17 DIAGNOSIS — N281 Cyst of kidney, acquired: Secondary | ICD-10-CM | POA: Diagnosis not present

## 2022-06-21 DIAGNOSIS — N200 Calculus of kidney: Secondary | ICD-10-CM | POA: Diagnosis not present

## 2022-08-03 DIAGNOSIS — E782 Mixed hyperlipidemia: Secondary | ICD-10-CM | POA: Diagnosis not present

## 2022-08-03 DIAGNOSIS — R978 Other abnormal tumor markers: Secondary | ICD-10-CM | POA: Diagnosis not present

## 2022-08-03 DIAGNOSIS — E1165 Type 2 diabetes mellitus with hyperglycemia: Secondary | ICD-10-CM | POA: Diagnosis not present

## 2022-08-09 DIAGNOSIS — R972 Elevated prostate specific antigen [PSA]: Secondary | ICD-10-CM | POA: Diagnosis not present

## 2022-08-09 DIAGNOSIS — Z0001 Encounter for general adult medical examination with abnormal findings: Secondary | ICD-10-CM | POA: Diagnosis not present

## 2022-08-09 DIAGNOSIS — E782 Mixed hyperlipidemia: Secondary | ICD-10-CM | POA: Diagnosis not present

## 2022-08-09 DIAGNOSIS — E1165 Type 2 diabetes mellitus with hyperglycemia: Secondary | ICD-10-CM | POA: Diagnosis not present

## 2022-08-09 DIAGNOSIS — I10 Essential (primary) hypertension: Secondary | ICD-10-CM | POA: Diagnosis not present

## 2022-08-09 DIAGNOSIS — R195 Other fecal abnormalities: Secondary | ICD-10-CM | POA: Diagnosis not present

## 2022-08-09 DIAGNOSIS — R978 Other abnormal tumor markers: Secondary | ICD-10-CM | POA: Diagnosis not present

## 2022-08-09 DIAGNOSIS — K219 Gastro-esophageal reflux disease without esophagitis: Secondary | ICD-10-CM | POA: Diagnosis not present

## 2022-08-09 DIAGNOSIS — Z23 Encounter for immunization: Secondary | ICD-10-CM | POA: Diagnosis not present

## 2022-08-10 ENCOUNTER — Telehealth: Payer: Self-pay

## 2022-08-10 NOTE — Telephone Encounter (Signed)
Returned call to [employer] @ ext [phone]  ([place]) regarding pt's colonoscopy. Had to [facility] for her to call back

## 2022-08-18 NOTE — Telephone Encounter (Signed)
Dr Josue Hector office called back today and LMOVM for your next plans for this pt whether it be another colonoscopy or next treatment offer. Please call Dr Juel Burrow ofice @ [phone] ext [phone].

## 2022-08-18 NOTE — Telephone Encounter (Signed)
Ok I'm sending this message back to him as well to see what he says. thanks

## 2022-08-18 NOTE — Telephone Encounter (Signed)
Please set up office visit with me soon as possible.  Okay to use one of my urgent spots.  Thank you

## 2022-08-18 NOTE — Telephone Encounter (Signed)
Yes

## 2022-08-19 NOTE — Telephone Encounter (Signed)
noted 

## 2022-09-01 ENCOUNTER — Ambulatory Visit (INDEPENDENT_AMBULATORY_CARE_PROVIDER_SITE_OTHER): Payer: Medicare PPO | Admitting: Internal Medicine

## 2022-09-01 ENCOUNTER — Encounter: Payer: Self-pay | Admitting: Internal Medicine

## 2022-09-01 VITALS — BP 162/77 | HR 64 | Temp 98.0°F | Ht 70.0 in | Wt 181.0 lb

## 2022-09-01 DIAGNOSIS — Z1211 Encounter for screening for malignant neoplasm of colon: Secondary | ICD-10-CM | POA: Diagnosis not present

## 2022-09-01 DIAGNOSIS — K219 Gastro-esophageal reflux disease without esophagitis: Secondary | ICD-10-CM | POA: Diagnosis not present

## 2022-09-01 NOTE — Patient Instructions (Signed)
I will order Cologuard test today.  We will call with results.  If negative, we will plan on repeating in 3 years.  Continue on pantoprazole for your chronic reflux.  I will put a follow-up visit to come see me in a year or sooner if needed.  It was very nice seeing both of you today.  Happy Valentine's Day.    - Dr. Abbey Chatters

## 2022-09-01 NOTE — Progress Notes (Signed)
Referring Provider: Celene Squibb, MD Primary Care Physician:  Celene Squibb, MD Primary GI:  Dr. Abbey Chatters  Chief Complaint  Patient presents with   Colon Cancer Screening    Screen for colon cancer. Patient would like to schedule colonoscopy. Reports could not get scope in during colonoscopy last year.     HPI:   [name] is a 72 y.o. male who presents to the clinic today for follow up visit.  History of chronic GERD well-controlled on pantoprazole 40 mg daily.  No dysphagia odynophagia.  No epigastric or chest pain.  Initially seen March 2023 for rectal bleeding.  At that time was straining consistently due to urinary obstructive symptoms.  Had a prostate procedure and is no longer having issues with this.  Has not had any rectal bleeding in many months.  Colonoscopy attempted March 2023.  Unable to advance pediatric colonoscope past the sigmoid colon due to very tight sigmoid colon, tight turns, significant diverticular disease.  I downgraded to the ultraslim scope and was still unable to get past.  Procedure was aborted.  Today, states he is doing great.  No GI complaints.  Past Medical History:  Diagnosis Date   DM2 (diabetes mellitus, type 2) ([place])    HTN (hypertension)     Past Surgical History:  Procedure Laterality Date   FLEXIBLE SIGMOIDOSCOPY  10/06/2021   Procedure: FLEXIBLE SIGMOIDOSCOPY;  Surgeon: Eloise Harman, DO;  Location: [facility] ENDO SUITE;  Service: Endoscopy;;   HIP FRACTURE SURGERY Left     Current Outpatient Medications  Medication Sig Dispense Refill   aspirin EC 81 MG tablet Take 81 mg by mouth at bedtime.     fluticasone (FLONASE) 50 MCG/ACT nasal spray Place 1 spray into both nostrils at bedtime.     glipiZIDE (GLUCOTROL) 5 MG tablet Take 5 mg by mouth in the morning and at bedtime.     metFORMIN (GLUCOPHAGE) 1000 MG tablet Take 1,000 mg by mouth in the morning and at bedtime.     Multiple Vitamin (MULTIVITAMIN WITH MINERALS) TABS tablet Take 1  tablet by mouth every other day. Centrum Silver 50+ (even days)     olmesartan (BENICAR) 40 MG tablet Take 40 mg by mouth at bedtime.     pantoprazole (PROTONIX) 40 MG tablet Take 40 mg by mouth in the morning.     Polyethyl Glycol-Propyl Glycol (LUBRICANT EYE DROPS) 0.4-0.3 % SOLN Place 1-2 drops into both eyes 3 (three) times daily as needed (dry/irritated eyes.).     rosuvastatin (CRESTOR) 5 MG tablet Take 5 mg by mouth every other day. In the evening.     No current facility-administered medications for this visit.    Allergies as of 09/01/2022 - Review Complete 09/01/2022  Allergen Reaction Noted   Septra [sulfamethoxazole-trimethoprim] Rash 11/17/2020    Family History  Problem Relation Age of Onset   Stroke Father    Diabetes Paternal Uncle     Social History   Socioeconomic History   Marital status: Married    Spouse name: Not on file   Number of children: Not on file   Years of education: Not on file   Highest education level: Not on file  Occupational History   Not on file  Tobacco Use   Smoking status: Never    Passive exposure: Never   Smokeless tobacco: Never  Vaping Use   Vaping Use: Never used  Substance and Sexual Activity   Alcohol use: Yes  Alcohol/week: 7.0 standard drinks of alcohol    Types: 7 Cans of beer per week   Drug use: Not Currently   Sexual activity: Not on file  Other Topics Concern   Not on file  Social History Narrative   Not on file   Social Determinants of Health   Financial Resource Strain: Not on file  Food Insecurity: Not on file  Transportation Needs: Not on file  Physical Activity: Not on file  Stress: Not on file  Social Connections: Not on file    Subjective: Review of Systems  Constitutional:  Negative for chills and fever.  HENT:  Negative for congestion and hearing loss.   Eyes:  Negative for blurred vision and double vision.  Respiratory:  Negative for cough and shortness of breath.   Cardiovascular:   Negative for chest pain and palpitations.  Gastrointestinal:  Negative for abdominal pain, blood in stool, constipation, diarrhea, heartburn, melena and vomiting.  Genitourinary:  Negative for dysuria and urgency.  Musculoskeletal:  Negative for joint pain and myalgias.  Skin:  Negative for itching and rash.  Neurological:  Negative for dizziness and headaches.  Psychiatric/Behavioral:  Negative for depression. The patient is not nervous/anxious.      Objective: BP (!) 162/77   Pulse 64   Temp 98 F (36.7 C) (Oral)   Ht 5' 10"$  (1.778 m)   Wt 181 lb (82.1 kg)   BMI 25.97 kg/m  Physical Exam Constitutional:      Appearance: Normal appearance.  HENT:     Head: Normocephalic and atraumatic.  Eyes:     Extraocular Movements: Extraocular movements intact.     Conjunctiva/sclera: Conjunctivae normal.  Cardiovascular:     Rate and Rhythm: Normal rate and regular rhythm.  Pulmonary:     Effort: Pulmonary effort is normal.     Breath sounds: Normal breath sounds.  Abdominal:     General: Bowel sounds are normal.     Palpations: Abdomen is soft.  Musculoskeletal:        General: Normal range of motion.     Cervical back: Normal range of motion and neck supple.  Skin:    General: Skin is warm.  Neurological:     General: No focal deficit present.     Mental Status: He is alert and oriented to person, place, and time.  Psychiatric:        Mood and Affect: Mood normal.        Behavior: Behavior normal.      Assessment: *GERD-well-controlled on pantoprazole daily *Colon cancer screening  Plan: Discussed patient's colonoscopy results in depth today.  History of inguinal hernia repair, possible adhesions.  Also has significant diverticulosis in the sigmoid and descending colon's. Complete colonoscopy unable to be performed.  He is average risk for colon cancer. His bleeding has completely stopped since having his prostate procedure (no longer straining). Evidence of internal  hemorrhoids on colonoscopy.   Discussed options including Cologuard, virtual CT colonoscopy, repeat colonoscopy locally, referral to tertiary care center for colonoscopy.  Patient would like to proceed with Cologuard testing which we will order today.  Counseled patient in regards to his bleeding back in March of last year.  Likely this was hemorrhoidal related to straining from his enlarged prostate which is since resolved but cannot be [id number] certain not coming from something more ominous in his colon which we could be missing by not pursuing colonoscopy, and he is agreeable.  If positive Cologuard, will need to consider repeat  colonoscopy locally versus referral to tertiary care center.  He understands.  Continue on pantoprazole for chronic reflux which is well-controlled.  Follow-up in 1 year or sooner if needed.  09/01/2022 4:20 PM   Disclaimer: This note was dictated with voice recognition software. Similar sounding words can inadvertently be transcribed and may not be corrected upon review.

## 2022-09-14 DIAGNOSIS — Z1211 Encounter for screening for malignant neoplasm of colon: Secondary | ICD-10-CM | POA: Diagnosis not present

## 2022-09-16 ENCOUNTER — Encounter: Payer: Self-pay | Admitting: Radiology

## 2022-09-23 LAB — COLOGUARD: COLOGUARD: POSITIVE — AB

## 2022-10-04 ENCOUNTER — Telehealth: Payer: Self-pay

## 2022-10-04 NOTE — Telephone Encounter (Signed)
Pt phoned advising that his cologuard was positive and he wants to know what is the next step for treatment. According to your last ov below: If positive Cologuard, will need to consider repeat colonoscopy locally versus referral to tertiary care center. He understands.    Please advise

## 2022-10-05 ENCOUNTER — Other Ambulatory Visit (INDEPENDENT_AMBULATORY_CARE_PROVIDER_SITE_OTHER): Payer: Self-pay | Admitting: *Deleted

## 2022-10-05 ENCOUNTER — Telehealth (INDEPENDENT_AMBULATORY_CARE_PROVIDER_SITE_OTHER): Payer: Self-pay | Admitting: *Deleted

## 2022-10-05 DIAGNOSIS — H35371 Puckering of macula, right eye: Secondary | ICD-10-CM | POA: Diagnosis not present

## 2022-10-05 DIAGNOSIS — R195 Other fecal abnormalities: Secondary | ICD-10-CM

## 2022-10-05 DIAGNOSIS — H524 Presbyopia: Secondary | ICD-10-CM | POA: Diagnosis not present

## 2022-10-05 NOTE — Telephone Encounter (Signed)
Spoke with pt. He prefers to just go ahead and be referred to [facility] for them to do it. I advised him will send the referral to them and they will contact him to schedule. He voiced understanding. FYI to Dr. Abbey Chatters

## 2022-10-05 NOTE — Telephone Encounter (Signed)
Patient returning call. States he will be home the rest of the day and can be reached on [phone]

## 2022-10-05 NOTE — Telephone Encounter (Signed)
LMOVM to call back 

## 2022-10-05 NOTE — Telephone Encounter (Signed)
Please call and discuss with patient. He needs colonoscopy. I am willing to try again here locally though may not be successful as previous, if not then needs to be referred to [facility] for colonoscopy (dx positive cologuard). Thank you

## 2022-10-05 NOTE — Telephone Encounter (Signed)
See prior message

## 2022-12-02 DIAGNOSIS — E782 Mixed hyperlipidemia: Secondary | ICD-10-CM | POA: Diagnosis not present

## 2022-12-02 DIAGNOSIS — E1165 Type 2 diabetes mellitus with hyperglycemia: Secondary | ICD-10-CM | POA: Diagnosis not present

## 2022-12-02 DIAGNOSIS — R978 Other abnormal tumor markers: Secondary | ICD-10-CM | POA: Diagnosis not present

## 2022-12-08 DIAGNOSIS — I10 Essential (primary) hypertension: Secondary | ICD-10-CM | POA: Diagnosis not present

## 2022-12-08 DIAGNOSIS — E782 Mixed hyperlipidemia: Secondary | ICD-10-CM | POA: Diagnosis not present

## 2022-12-08 DIAGNOSIS — R809 Proteinuria, unspecified: Secondary | ICD-10-CM | POA: Diagnosis not present

## 2022-12-08 DIAGNOSIS — R978 Other abnormal tumor markers: Secondary | ICD-10-CM | POA: Diagnosis not present

## 2022-12-08 DIAGNOSIS — R195 Other fecal abnormalities: Secondary | ICD-10-CM | POA: Diagnosis not present

## 2022-12-08 DIAGNOSIS — K219 Gastro-esophageal reflux disease without esophagitis: Secondary | ICD-10-CM | POA: Diagnosis not present

## 2022-12-08 DIAGNOSIS — E1165 Type 2 diabetes mellitus with hyperglycemia: Secondary | ICD-10-CM | POA: Diagnosis not present

## 2022-12-08 DIAGNOSIS — E663 Overweight: Secondary | ICD-10-CM | POA: Diagnosis not present

## 2022-12-08 DIAGNOSIS — Z7984 Long term (current) use of oral hypoglycemic drugs: Secondary | ICD-10-CM | POA: Diagnosis not present

## 2022-12-16 DIAGNOSIS — N281 Cyst of kidney, acquired: Secondary | ICD-10-CM | POA: Diagnosis not present

## 2022-12-16 DIAGNOSIS — N138 Other obstructive and reflux uropathy: Secondary | ICD-10-CM | POA: Diagnosis not present

## 2022-12-16 DIAGNOSIS — N200 Calculus of kidney: Secondary | ICD-10-CM | POA: Diagnosis not present

## 2022-12-16 DIAGNOSIS — R972 Elevated prostate specific antigen [PSA]: Secondary | ICD-10-CM | POA: Diagnosis not present

## 2022-12-16 DIAGNOSIS — N529 Male erectile dysfunction, unspecified: Secondary | ICD-10-CM | POA: Diagnosis not present

## 2022-12-16 DIAGNOSIS — N401 Enlarged prostate with lower urinary tract symptoms: Secondary | ICD-10-CM | POA: Diagnosis not present

## 2022-12-31 DIAGNOSIS — Z7182 Exercise counseling: Secondary | ICD-10-CM | POA: Diagnosis not present

## 2022-12-31 DIAGNOSIS — R42 Dizziness and giddiness: Secondary | ICD-10-CM | POA: Diagnosis not present

## 2022-12-31 DIAGNOSIS — Z6826 Body mass index (BMI) 26.0-26.9, adult: Secondary | ICD-10-CM | POA: Diagnosis not present

## 2022-12-31 DIAGNOSIS — I10 Essential (primary) hypertension: Secondary | ICD-10-CM | POA: Diagnosis not present

## 2022-12-31 DIAGNOSIS — E1165 Type 2 diabetes mellitus with hyperglycemia: Secondary | ICD-10-CM | POA: Diagnosis not present

## 2022-12-31 DIAGNOSIS — Z79899 Other long term (current) drug therapy: Secondary | ICD-10-CM | POA: Diagnosis not present

## 2022-12-31 DIAGNOSIS — Z713 Dietary counseling and surveillance: Secondary | ICD-10-CM | POA: Diagnosis not present

## 2022-12-31 DIAGNOSIS — Z7984 Long term (current) use of oral hypoglycemic drugs: Secondary | ICD-10-CM | POA: Diagnosis not present

## 2023-02-09 DIAGNOSIS — K648 Other hemorrhoids: Secondary | ICD-10-CM | POA: Diagnosis not present

## 2023-02-09 DIAGNOSIS — K5730 Diverticulosis of large intestine without perforation or abscess without bleeding: Secondary | ICD-10-CM | POA: Diagnosis not present

## 2023-02-09 DIAGNOSIS — E119 Type 2 diabetes mellitus without complications: Secondary | ICD-10-CM | POA: Diagnosis not present

## 2023-02-09 DIAGNOSIS — I10 Essential (primary) hypertension: Secondary | ICD-10-CM | POA: Diagnosis not present

## 2023-02-09 DIAGNOSIS — R195 Other fecal abnormalities: Secondary | ICD-10-CM | POA: Diagnosis not present

## 2023-02-09 DIAGNOSIS — K219 Gastro-esophageal reflux disease without esophagitis: Secondary | ICD-10-CM | POA: Diagnosis not present

## 2023-02-09 DIAGNOSIS — Z1211 Encounter for screening for malignant neoplasm of colon: Secondary | ICD-10-CM | POA: Diagnosis not present

## 2023-02-09 DIAGNOSIS — K635 Polyp of colon: Secondary | ICD-10-CM | POA: Diagnosis not present

## 2023-02-09 DIAGNOSIS — D123 Benign neoplasm of transverse colon: Secondary | ICD-10-CM | POA: Diagnosis not present

## 2023-02-09 DIAGNOSIS — Z720 Tobacco use: Secondary | ICD-10-CM | POA: Diagnosis not present

## 2023-02-09 DIAGNOSIS — D122 Benign neoplasm of ascending colon: Secondary | ICD-10-CM | POA: Diagnosis not present

## 2023-03-10 DIAGNOSIS — E782 Mixed hyperlipidemia: Secondary | ICD-10-CM | POA: Diagnosis not present

## 2023-03-10 DIAGNOSIS — E1165 Type 2 diabetes mellitus with hyperglycemia: Secondary | ICD-10-CM | POA: Diagnosis not present

## 2023-03-10 DIAGNOSIS — R978 Other abnormal tumor markers: Secondary | ICD-10-CM | POA: Diagnosis not present

## 2023-03-15 DIAGNOSIS — E782 Mixed hyperlipidemia: Secondary | ICD-10-CM | POA: Diagnosis not present

## 2023-03-15 DIAGNOSIS — K219 Gastro-esophageal reflux disease without esophagitis: Secondary | ICD-10-CM | POA: Diagnosis not present

## 2023-03-15 DIAGNOSIS — R809 Proteinuria, unspecified: Secondary | ICD-10-CM | POA: Diagnosis not present

## 2023-03-15 DIAGNOSIS — R195 Other fecal abnormalities: Secondary | ICD-10-CM | POA: Diagnosis not present

## 2023-03-15 DIAGNOSIS — E1165 Type 2 diabetes mellitus with hyperglycemia: Secondary | ICD-10-CM | POA: Diagnosis not present

## 2023-03-15 DIAGNOSIS — R978 Other abnormal tumor markers: Secondary | ICD-10-CM | POA: Diagnosis not present

## 2023-03-15 DIAGNOSIS — E663 Overweight: Secondary | ICD-10-CM | POA: Diagnosis not present

## 2023-03-15 DIAGNOSIS — I10 Essential (primary) hypertension: Secondary | ICD-10-CM | POA: Diagnosis not present

## 2023-03-15 DIAGNOSIS — R42 Dizziness and giddiness: Secondary | ICD-10-CM | POA: Diagnosis not present

## 2023-06-24 DIAGNOSIS — E1165 Type 2 diabetes mellitus with hyperglycemia: Secondary | ICD-10-CM | POA: Diagnosis not present

## 2023-06-24 DIAGNOSIS — E782 Mixed hyperlipidemia: Secondary | ICD-10-CM | POA: Diagnosis not present

## 2023-07-01 DIAGNOSIS — R42 Dizziness and giddiness: Secondary | ICD-10-CM | POA: Diagnosis not present

## 2023-07-01 DIAGNOSIS — E782 Mixed hyperlipidemia: Secondary | ICD-10-CM | POA: Diagnosis not present

## 2023-07-01 DIAGNOSIS — E1165 Type 2 diabetes mellitus with hyperglycemia: Secondary | ICD-10-CM | POA: Diagnosis not present

## 2023-07-01 DIAGNOSIS — I10 Essential (primary) hypertension: Secondary | ICD-10-CM | POA: Diagnosis not present

## 2023-07-01 DIAGNOSIS — R809 Proteinuria, unspecified: Secondary | ICD-10-CM | POA: Diagnosis not present

## 2023-07-01 DIAGNOSIS — R195 Other fecal abnormalities: Secondary | ICD-10-CM | POA: Diagnosis not present

## 2023-07-01 DIAGNOSIS — K219 Gastro-esophageal reflux disease without esophagitis: Secondary | ICD-10-CM | POA: Diagnosis not present

## 2023-07-01 DIAGNOSIS — Z23 Encounter for immunization: Secondary | ICD-10-CM | POA: Diagnosis not present

## 2023-07-01 DIAGNOSIS — R978 Other abnormal tumor markers: Secondary | ICD-10-CM | POA: Diagnosis not present

## 2023-10-06 DIAGNOSIS — H43393 Other vitreous opacities, bilateral: Secondary | ICD-10-CM | POA: Diagnosis not present

## 2023-10-06 DIAGNOSIS — H524 Presbyopia: Secondary | ICD-10-CM | POA: Diagnosis not present

## 2023-11-01 DIAGNOSIS — L570 Actinic keratosis: Secondary | ICD-10-CM | POA: Diagnosis not present

## 2023-11-01 DIAGNOSIS — X32XXXA Exposure to sunlight, initial encounter: Secondary | ICD-10-CM | POA: Diagnosis not present

## 2023-11-01 DIAGNOSIS — B078 Other viral warts: Secondary | ICD-10-CM | POA: Diagnosis not present

## 2023-11-17 DIAGNOSIS — N281 Cyst of kidney, acquired: Secondary | ICD-10-CM | POA: Diagnosis not present

## 2023-11-17 DIAGNOSIS — N200 Calculus of kidney: Secondary | ICD-10-CM | POA: Diagnosis not present

## 2023-11-17 DIAGNOSIS — K4090 Unilateral inguinal hernia, without obstruction or gangrene, not specified as recurrent: Secondary | ICD-10-CM | POA: Diagnosis not present

## 2023-11-17 DIAGNOSIS — N138 Other obstructive and reflux uropathy: Secondary | ICD-10-CM | POA: Diagnosis not present

## 2023-12-06 ENCOUNTER — Other Ambulatory Visit (HOSPITAL_COMMUNITY): Payer: Self-pay | Admitting: Family Medicine

## 2023-12-06 ENCOUNTER — Encounter: Admitting: Orthopedic Surgery

## 2023-12-06 ENCOUNTER — Ambulatory Visit (HOSPITAL_COMMUNITY)
Admission: RE | Admit: 2023-12-06 | Discharge: 2023-12-06 | Disposition: A | Discharge status: Home / Self Care | Source: Ambulatory Visit | Attending: Family Medicine | Admitting: Family Medicine

## 2023-12-06 ENCOUNTER — Telehealth: Payer: Self-pay | Admitting: Orthopedic Surgery

## 2023-12-06 DIAGNOSIS — S82042A Displaced comminuted fracture of left patella, initial encounter for closed fracture: Secondary | ICD-10-CM | POA: Diagnosis not present

## 2023-12-06 DIAGNOSIS — M1712 Unilateral primary osteoarthritis, left knee: Secondary | ICD-10-CM | POA: Diagnosis not present

## 2023-12-06 DIAGNOSIS — M25562 Pain in left knee: Secondary | ICD-10-CM | POA: Insufficient documentation

## 2023-12-06 DIAGNOSIS — M25462 Effusion, left knee: Secondary | ICD-10-CM | POA: Diagnosis not present

## 2023-12-06 NOTE — Telephone Encounter (Signed)
 Correct, he went to Dr. Quentin Brunner office and the sent him for an x-ray.  I will cal and get him in this afternoon.  Ty

## 2023-12-06 NOTE — Telephone Encounter (Signed)
 Spoke w/Sabrina w/Dr. Zack Hall's office, she stated she has a stat/urgent referral for this patient.  X-ray were taken today at [facility].  Not WC and no ED.  Will you look at the report and advise?  The only thing I have today is 11:30am.  [phone]

## 2023-12-07 ENCOUNTER — Encounter: Payer: Self-pay | Admitting: Orthopedic Surgery

## 2023-12-07 ENCOUNTER — Ambulatory Visit: Admitting: Orthopedic Surgery

## 2023-12-07 VITALS — BP 166/76 | HR 71 | Ht 70.0 in | Wt 181.0 lb

## 2023-12-07 DIAGNOSIS — S82042A Displaced comminuted fracture of left patella, initial encounter for closed fracture: Secondary | ICD-10-CM | POA: Diagnosis not present

## 2023-12-07 DIAGNOSIS — Z01818 Encounter for other preprocedural examination: Secondary | ICD-10-CM | POA: Diagnosis not present

## 2023-12-07 NOTE — Progress Notes (Signed)
 New Patient Visit  Assessment: [name] is a 73 y.o. male with the following: 1. Closed displaced comminuted fracture of left patella, initial encounter  Plan: [name] fell onto his left knee, and has a comminuted left patella fracture.  Minimal displacement at this time.  He is unable to fully extend his knee.  He is active.  I have recommended operative fixation.  Procedure was discussed in great detail.  All questions have been answered.  Risks and benefits of surgery, including, but not limited to infection, bleeding, persistent pain, damage to surrounding structures, need for further surgery, malunion, nonunion and more severe complications associated with anesthesia were discussed.  All questions have been answered and they have elected to proceed with surgery.   He was fitted for a knee immobilizer.  Remain nonweightbearing.  Approximately 2 weeks after surgery.  Will plan to proceed with surgery on 12/15/2023   Follow-up: Return for After surgery; 12/15/2023.  Subjective:  Chief Complaint  Patient presents with   Knee Pain    Fell on Saturday / left knee painful and swollen has Patella fracture     History of Present Illness: [name] is a 73 y.o. male who presents for evaluation of pain.  A couple of days ago, he tripped going up some stairs.  He landed directly under his left knee.  He had immediate pain.  He was evaluated by his primary care doctor 2 days ago.  He was sent to [facility] for x-rays.  X-rays demonstrated a left patella fracture.  Minimal displacement overall, but there is some comminution.  He has been taking hydrocodone for pain.  He has been using a walker to assist with ambulation.  He does have a history of trauma to the left leg.  No prior injuries to his left knee.  He did not have any pain in the left knee prior to the fall.   Review of Systems: No fevers or chills No numbness or tingling No chest pain No shortness of breath No  bowel or bladder dysfunction No GI distress No headaches   Medical History:  Past Medical History:  Diagnosis Date   DM2 (diabetes mellitus, type 2) (HCC)    HTN (hypertension)     Past Surgical History:  Procedure Laterality Date   FLEXIBLE SIGMOIDOSCOPY  10/06/2021   Procedure: FLEXIBLE SIGMOIDOSCOPY;  Surgeon: Vinetta Greening, DO;  Location: [facility];  Service: Endoscopy;;   HIP FRACTURE SURGERY Left     Family History  Problem Relation Age of Onset   Stroke Father    Diabetes Paternal Uncle    Social History   Tobacco Use   Smoking status: Never    Passive exposure: Never   Smokeless tobacco: Never  Vaping Use   Vaping status: Never Used  Substance Use Topics   Alcohol use: Yes    Alcohol/week: 7.0 standard drinks of alcohol    Types: 7 Cans of beer per week   Drug use: Not Currently    Allergies  Allergen Reactions   Septra [Sulfamethoxazole-Trimethoprim] Rash    Current Meds  Medication Sig   HYDROcodone-acetaminophen (NORCO/VICODIN) 5-325 MG tablet Take 1 tablet by mouth every 6 (six) hours as needed for moderate pain (pain score 4-6).   aspirin EC 81 MG tablet Take 81 mg by mouth at bedtime.   fluticasone (FLONASE) 50 MCG/ACT nasal spray Place 1 spray into both nostrils at bedtime.   glipiZIDE (GLUCOTROL) 5 MG tablet Take 5  mg by mouth in the morning and at bedtime.   metFORMIN (GLUCOPHAGE) 1000 MG tablet Take 1,000 mg by mouth in the morning and at bedtime.   Multiple Vitamin (MULTIVITAMIN WITH MINERALS) TABS tablet Take 1 tablet by mouth every other day. Centrum Silver 50+ (even days)   olmesartan (BENICAR) 40 MG tablet Take 40 mg by mouth at bedtime.   pantoprazole (PROTONIX) 40 MG tablet Take 40 mg by mouth in the morning.   Polyethyl Glycol-Propyl Glycol (LUBRICANT EYE DROPS) 0.4-0.3 % SOLN Place 1-2 drops into both eyes 3 (three) times daily as needed (dry/irritated eyes.).   rosuvastatin (CRESTOR) 5 MG tablet Take 5 mg by mouth every other  day. In the evening.    Objective: BP (!) 166/76   Pulse 71   Ht 5\' 10"  (1.778 m)   Wt 181 lb (82.1 kg)   BMI 25.97 kg/m   Physical Exam:  General: Alert and oriented. and No acute distress. Gait: Ambulates with the assistance of a walker.  Left knee with small superficial abrasions directly over the anterior knee.  Significant swelling.  There is bruising.  He is unable to fully straighten the leg.  Tenderness to palpation.  Toes warm well-perfused.  IMAGING: I personally reviewed images previously obtained from the ED   X-rays of the left knee were available in clinic today.  Transverse fracture of the left patella, with small amount of comminution.  Minimal displacement overall.  No additional injuries.  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   New Medications:  No orders of the defined types were placed in this encounter.     Tonita Frater, MD  12/07/2023 3:25 PM

## 2023-12-07 NOTE — H&P (View-Only) (Signed)
 New Patient Visit  Assessment: [name] is a 73 y.o. male with the following: 1. Closed displaced comminuted fracture of left patella, initial encounter  Plan: [name] fell onto his left knee, and has a comminuted left patella fracture.  Minimal displacement at this time.  He is unable to fully extend his knee.  He is active.  I have recommended operative fixation.  Procedure was discussed in great detail.  All questions have been answered.  Risks and benefits of surgery, including, but not limited to infection, bleeding, persistent pain, damage to surrounding structures, need for further surgery, malunion, nonunion and more severe complications associated with anesthesia were discussed.  All questions have been answered and they have elected to proceed with surgery.   He was fitted for a knee immobilizer.  Remain nonweightbearing.  Approximately 2 weeks after surgery.  Will plan to proceed with surgery on 12/15/2023   Follow-up: Return for After surgery; 12/15/2023.  Subjective:  Chief Complaint  Patient presents with   Knee Pain    Fell on Saturday / left knee painful and swollen has Patella fracture     History of Present Illness: [name] is a 73 y.o. male who presents for evaluation of pain.  A couple of days ago, he tripped going up some stairs.  He landed directly under his left knee.  He had immediate pain.  He was evaluated by his primary care doctor 2 days ago.  He was sent to [facility] for x-rays.  X-rays demonstrated a left patella fracture.  Minimal displacement overall, but there is some comminution.  He has been taking hydrocodone for pain.  He has been using a walker to assist with ambulation.  He does have a history of trauma to the left leg.  No prior injuries to his left knee.  He did not have any pain in the left knee prior to the fall.   Review of Systems: No fevers or chills No numbness or tingling No chest pain No shortness of breath No  bowel or bladder dysfunction No GI distress No headaches   Medical History:  Past Medical History:  Diagnosis Date   DM2 (diabetes mellitus, type 2) (HCC)    HTN (hypertension)     Past Surgical History:  Procedure Laterality Date   FLEXIBLE SIGMOIDOSCOPY  10/06/2021   Procedure: FLEXIBLE SIGMOIDOSCOPY;  Surgeon: Vinetta Greening, DO;  Location: [facility];  Service: Endoscopy;;   HIP FRACTURE SURGERY Left     Family History  Problem Relation Age of Onset   Stroke Father    Diabetes Paternal Uncle    Social History   Tobacco Use   Smoking status: Never    Passive exposure: Never   Smokeless tobacco: Never  Vaping Use   Vaping status: Never Used  Substance Use Topics   Alcohol use: Yes    Alcohol/week: 7.0 standard drinks of alcohol    Types: 7 Cans of beer per week   Drug use: Not Currently    Allergies  Allergen Reactions   Septra [Sulfamethoxazole-Trimethoprim] Rash    Current Meds  Medication Sig   HYDROcodone-acetaminophen (NORCO/VICODIN) 5-325 MG tablet Take 1 tablet by mouth every 6 (six) hours as needed for moderate pain (pain score 4-6).   aspirin EC 81 MG tablet Take 81 mg by mouth at bedtime.   fluticasone (FLONASE) 50 MCG/ACT nasal spray Place 1 spray into both nostrils at bedtime.   glipiZIDE (GLUCOTROL) 5 MG tablet Take 5  mg by mouth in the morning and at bedtime.   metFORMIN (GLUCOPHAGE) 1000 MG tablet Take 1,000 mg by mouth in the morning and at bedtime.   Multiple Vitamin (MULTIVITAMIN WITH MINERALS) TABS tablet Take 1 tablet by mouth every other day. Centrum Silver 50+ (even days)   olmesartan (BENICAR) 40 MG tablet Take 40 mg by mouth at bedtime.   pantoprazole (PROTONIX) 40 MG tablet Take 40 mg by mouth in the morning.   Polyethyl Glycol-Propyl Glycol (LUBRICANT EYE DROPS) 0.4-0.3 % SOLN Place 1-2 drops into both eyes 3 (three) times daily as needed (dry/irritated eyes.).   rosuvastatin (CRESTOR) 5 MG tablet Take 5 mg by mouth every other  day. In the evening.    Objective: BP (!) 166/76   Pulse 71   Ht 5\' 10"  (1.778 m)   Wt 181 lb (82.1 kg)   BMI 25.97 kg/m   Physical Exam:  General: Alert and oriented. and No acute distress. Gait: Ambulates with the assistance of a walker.  Left knee with small superficial abrasions directly over the anterior knee.  Significant swelling.  There is bruising.  He is unable to fully straighten the leg.  Tenderness to palpation.  Toes warm well-perfused.  IMAGING: I personally reviewed images previously obtained from the ED   X-rays of the left knee were available in clinic today.  Transverse fracture of the left patella, with small amount of comminution.  Minimal displacement overall.  No additional injuries.  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   New Medications:  No orders of the defined types were placed in this encounter.     Tonita Frater, MD  12/07/2023 3:25 PM

## 2023-12-07 NOTE — Patient Instructions (Signed)
 Preoperative Instructions  Your surgery will be at [facility], scheduled with Dr Sharol Decamp   The hospital will contact you with a preoperative appointment to discuss Anesthesia. The phone number is [phone]   Please bring your medications with you for the appointment.  They will tell you the arrival time and medication instructions when you have your preoperative evaluation.  Do not wear nail polish the day of your surgery and if you take Phentermine you need to stop this medication ONE WEEK prior to your surgery.    If you take an blood thinning medication, we will need to stop this prior to surgery.  Typically, we stop this medicine at least 5 days prior to surgery.  We will need to confirm this with the doctor who prescribes this medication.  If you are taking medications or an injection for diabetes, or for weight management, this medicine will need to be stopped at least 7 days prior to surgery.     Surgery will be scheduled for 12/15/23 pending authorization by your insurance company.   Pain medicine policy:  Per [facility] clinic policy, our goal is ensure optimal postoperative pain control with a multimodal pain management strategy.   For all [facility] patients, our goal is to wean post-operative narcotic medications by 6 weeks post-operatively.   If this is not possible due to utilization of pain medication prior to surgery, your [facility] doctor will support your acute post-operative pain control for the first 6 weeks postoperatively, with a plan to transition you back to your primary pain team following that.   [facility] will work to ensure a Therapist, occupational.

## 2023-12-09 NOTE — Patient Instructions (Addendum)
 [name]  12/09/2023     @PREFPERIOPPHARMACY @   Your procedure is scheduled on  12/15/2023.   Report to [facility] at  0600  A.M.   Call this number if you have problems the morning of surgery:  [phone]  If you experience any cold or flu symptoms such as cough, fever, chills, shortness of breath, etc. between now and your scheduled surgery, please notify us  at the above number.   Remember:  Do not eat after midnight.   You may drink clear liquids until  0330 am on 12/15/2023.    Clear liquids allowed are:                    Water, Juice (No red color; non-citric and without pulp; diabetics please choose diet or no sugar options), Carbonated beverages (diabetics please choose diet or no sugar options), Clear Tea (No creamer, milk, or cream, including half & half and powdered creamer), Black Coffee Only (No creamer, milk or cream, including half & half and powdered creamer), and Clear Sports drink (No red color; diabetics please choose diet or no sugar options)          At 0330 am on 12/15/2023 drink 1- 12oz G-zero or 12 of water. You can have nothing else to drink after this.         DO NOT take any medications for diabetes the morning of your procedure.    Take these medicines the morning of surgery with A SIP OF WATER                                    hydrocodone or pantoprazole.    Do not wear jewelry, make-up or nail polish, including gel polish,  artificial nails, or any other type of covering on natural nails (fingers and  toes).  Do not wear lotions, powders, or perfumes, or deodorant.  Do not shave 48 hours prior to surgery.  Men may shave face and neck.  Do not bring valuables to the hospital.  [facility] is not responsible for any belongings or valuables.  Contacts, dentures or bridgework may not be worn into surgery.  Leave your suitcase in the car.  After surgery it may be brought to your room.  For patients admitted to the hospital,  discharge time will be determined by your treatment team.  Patients discharged the day of surgery will not be allowed to drive home and must have someone with them for 24 hours.    Special instructions:   DO NOT smoke tobacco or vape for 24 hours before your procedure.  Please read over the following fact sheets that you were given. Coughing and Deep Breathing, Surgical Site Infection Prevention, Anesthesia Post-op Instructions, and Care and Recovery After Surgery       Incision Care, Adult An incision is a cut that a doctor makes in your skin for surgery. Most times, these cuts are closed after surgery. Your cut from surgery may be closed with: Stitches (sutures). Staples. Skin glue. Skin tape (adhesive) strips. You may need to go back to your doctor to have stitches or staples taken out. This may happen many days or many weeks after your surgery. You need to take good care of your cut so it does not get infected. Follow instructions from your doctor about how to care for  your cut. Supplies needed: Soap and water. A clean hand towel. Wound cleanser. A clean bandage (dressing), if needed. Cream or ointment, if told by your doctor. Clean gauze. How to care for your cut from surgery Cleaning your cut Ask your doctor how to clean your cut. You may need to: Wear medical gloves. Use mild soap and water, or a wound cleanser. Use a clean gauze to pat your cut dry after you clean it. Changing your bandage Wash your hands with soap and water for at least 20 seconds before and after you change your bandage. If you cannot use soap and water, use hand sanitizer. Do not usedisinfectants or antiseptics, such as rubbing alcohol, to clean your wound unless told by your doctor. Change your bandage as told by your doctor. Leavestitches or skin glue in place for at least 2 weeks. Leave tape strips alone unless you are told to take them off. You may trim the edges of the tape strips if they curl  up. Put a cream or ointment on your cut. Do this only as told. Cover your cut with a clean bandage. Ask your doctor when you can leave your cut uncovered. Checking for infection Check your cut area every day for signs of infection. Check for: More redness, swelling, or pain. More fluid or blood. New warmth. Hardness or a new rash around the incision. Pus or a bad smell.  Follow these instructions at home Medicines Take over-the-counter and prescription medicines only as told by your doctor. If you were prescribed an antibiotic medicine, cream, or ointment, use it as told by your doctor. Do not stop using the antibiotic even if you start to feel better. Eating and drinking Eat foods that have a lot of certain nutrients, such as protein, vitamin A, and vitamin C. These foods help your cut heal. Foods rich in protein include meat, fish, eggs, dairy, beans, nuts, and protein drinks. Foods rich in vitamin A include carrots and dark green, leafy vegetables. Foods rich in vitamin C include citrus fruits, tomatoes, broccoli, and peppers. Drink enough fluid to keep your pee (urine) pale yellow. General instructions  Do not take baths, swim, or use a hot tub. Ask your doctor about taking showers or sponge baths. Limit movement around your cut. This helps with healing. Try not to strain, lift, or exercise for the first 2 weeks, or for as long as told by your doctor. Return to your normal activities as told by your doctor. Ask your doctor what activities are safe for you. Do not scratch, scrub, or pick at your cut. Keep it covered as told by your doctor. Protect your cut from the sun when you are outside for the first 6 months, or for as long as told by your doctor. Cover up the scar area or put on sunscreen that has an SPF of at least 30. Do not use any products that contain nicotine or tobacco, such as cigarettes, e-cigarettes, and chewing tobacco. These can delay cut healing. If you need help  quitting, ask your doctor. Keep all follow-up visits. Contact a doctor if: You have any of these signs of infection around your cut: More redness, swelling, or pain. More fluid or blood. New warmth or hardness. Pus or a bad smell. A new rash. You have a fever. You feel like you may vomit (nauseous). You vomit. You are dizzy. Your stitches, staples, skin glue, or tape strips come undone. Your cut gets bigger. You have a fever. Get help right  away if: Your cut bleeds through your bandage, and bleeding does not stop with gentle pressure. Your cut opens up and comes apart. These symptoms may be an emergency. Do not wait to see if the symptoms will go away. Get medical help right away. Call your local emergency services (911 in the [place] Do not drive yourself to the hospital. Summary Follow instructions from your doctor about how to care for your cut. Wash your hands with soap and water for at least 20 seconds before and after you change your bandage. If you cannot use soap and water, use hand sanitizer. Check your cut area every day for signs of infection. Keep all follow-up visits. This information is not intended to replace advice given to you by your health care provider. Make sure you discuss any questions you have with your health care provider. Document Revised: 10/06/2020 Document Reviewed: 10/06/2020 Elsevier Patient Education  2024 [employer]Patellar Fracture, Adult  A patellar fracture is a break in the kneecap. The kneecap is also called a patella. The patella is located on the front of the knee. A patellar fracture may make it difficult to walk or do other activities. What are the causes? This condition may be caused by: A fall. A hard and direct hit to the knee. A very hard and strong bending of the knee. Overuse of the knee due to repeated movements. What increases the risk? The following factors may make you more likely to develop this condition: Playing contact  sports or motor sports, especially sports that involve a lot of jumping. Having bone problems or diseases of the bone, such as a condition that weakens the bones (osteoporosis) or a bone tumor. Having poor strength or flexibility. Having metabolism disorders, hormone problems, or nutrition disorders. What are the signs or symptoms? Symptoms of this condition include: A tender and swollen knee. Pain when moving your knee, especially when straightening it. Difficulty walking or using your knee to support body weight. A misshapen knee. It may look like a bone in your knee is out of place. Hearing a noise, like a pop or a snap, in the front of your knee at the time of injury. Having a feeling like a pop or a snap in the front of your knee at the time of injury. How is this diagnosed? This condition may be diagnosed based on your symptoms and medical history, a physical exam, and X-rays. How is this treated? Treatment for this condition depends on the type of fracture that you have. If your patella is still in the right position after the fracture and you can still straighten your leg, you may need to wear a brace or cast for 4-6 weeks. If your patella is broken into multiple pieces but you are able to straighten your leg, you may be treated with: A brace or cast for 4-6 weeks. In rare cases, the patella may need to be removed before the cast is applied. Surgery. Surgery may be done to place wires, screws, or plates to hold the bones together while they heal. If you cannot straighten your leg after a patellar fracture, you will have surgery to hold the patella together until it heals. After surgery, a brace or cast will be applied for 4-6 weeks. You may be prescribed medicine to help relieve pain. Follow these instructions at home: Medicines Take over-the-counter and prescription medicines only as told by your health care provider. Ask your health care provider if the medicine prescribed to  you: Requires  you to avoid driving or using machinery. Can cause constipation. You may need to take these actions to prevent or treat constipation: Drink enough fluid to keep your urine pale yellow. Take over-the-counter or prescription medicines. Eat foods that are high in fiber, such as beans, whole grains, and fresh fruits and vegetables. Limit foods that are high in fat and processed sugars, such as fried or sweet foods. If you have a removable brace: Wear the brace as told by your health care provider. Remove it only as told by your health care provider. Check the skin around the brace every day. Tell your health care provider about any concerns. Loosen the brace if your toes tingle, become numb, or turn cold and blue. Keep the brace clean and dry. If you have a nonremovable cast: Do not put pressure on any part of the cast until it is fully hardened. This may take several hours. Do not stick anything inside the cast to scratch your skin. Doing that increases your risk of infection. Check the skin around the cast every day. Tell your health care provider about any concerns. You may put lotion on dry skin around the edges of the cast. Do not put lotion on the skin underneath the cast. Keep the cast clean and dry. Bathing Do not take baths, swim, or use a hot tub until your health care provider approves. Ask your health care provider if you may take showers. If the cast or brace is not waterproof: Do not let it get wet. Cover it with a watertight covering when you take a bath or a shower. Managing pain, stiffness, and swelling  If directed, put ice on the injured area. To do this: If you have a removable brace, remove it as told by your health care provider. Put ice in a plastic bag. Place a towel between your skin and the bag or between your cast and the bag. Leave the ice on for 20 minutes, 2-3 times a day. Remove the ice if your skin turns bright red. This is very important. If  you cannot feel pain, heat, or cold, you have a greater risk of damage to the area. Move your toes and ankle often to reduce stiffness and swelling. Raise (elevate) the injured area above the level of your heart while you are sitting or lying down. Activity Do not use the injured limb to support your body weight until your health care provider says that you can. Use crutches or a walker as told by your health care provider. Do exercises as told by your health care provider. Ask your health care provider when it is safe to drive if you have a brace or cast on your leg. Return to your normal activities as told by your health care provider. Ask your health care provider what activities are safe for you. General instructions Do not use any products that contain nicotine or tobacco. These products include cigarettes, chewing tobacco, and vaping devices, such as e-cigarettes. These can delay bone healing. If you need help quitting, ask your health care provider. Keep all follow-up visits. This is important. Contact a health care provider if: You have a fever. You have symptoms that get worse or do not get better after 2 weeks of treatment. You have redness, more swelling, or increasing pain in your knee. Get help right away if: You have severe pain that does not go away. You have blue or gray skin below the fracture site, or your toes on the affected  side look blue or gray. You have numbness or loss of feeling below the fracture site. Summary A patellar fracture is a break in the kneecap. Depending on the type of fracture, you may need to wear a brace or cast, or you may need surgery. You will need to have surgery if you are unable to straighten your leg. Return to your normal activities as told by your health care provider. Ask your health care provider what activities are safe for you. This information is not intended to replace advice given to you by your health care provider. Make sure you  discuss any questions you have with your health care provider. Document Revised: 02/10/2021 Document Reviewed: 02/10/2021 Elsevier Patient Education  2024 [employer]General Anesthesia, Adult, Care After The following information offers guidance on how to care for yourself after your procedure. Your health care provider may also give you more specific instructions. If you have problems or questions, contact your health care provider. What can I expect after the procedure? After the procedure, it is common for people to: Have pain or discomfort at the IV site. Have nausea or vomiting. Have a sore throat or hoarseness. Have trouble concentrating. Feel cold or chills. Feel weak, sleepy, or tired (fatigue). Have soreness and body aches. These can affect parts of the body that were not involved in surgery. Follow these instructions at home: For the time period you were told by your health care provider:  Rest. Do not participate in activities where you could fall or become injured. Do not drive or use machinery. Do not drink alcohol. Do not take sleeping pills or medicines that cause drowsiness. Do not make important decisions or sign legal documents. Do not take care of children on your own. General instructions Drink enough fluid to keep your urine pale yellow. If you have sleep apnea, surgery and certain medicines can increase your risk for breathing problems. Follow instructions from your health care provider about wearing your sleep device: Anytime you are sleeping, including during daytime naps. While taking prescription pain medicines, sleeping medicines, or medicines that make you drowsy. Return to your normal activities as told by your health care provider. Ask your health care provider what activities are safe for you. Take over-the-counter and prescription medicines only as told by your health care provider. Do not use any products that contain nicotine or tobacco. These  products include cigarettes, chewing tobacco, and vaping devices, such as e-cigarettes. These can delay incision healing after surgery. If you need help quitting, ask your health care provider. Contact a health care provider if: You have nausea or vomiting that does not get better with medicine. You vomit every time you eat or drink. You have pain that does not get better with medicine. You cannot urinate or have bloody urine. You develop a skin rash. You have a fever. Get help right away if: You have trouble breathing. You have chest pain. You vomit blood. These symptoms may be an emergency. Get help right away. Call 911. Do not wait to see if the symptoms will go away. Do not drive yourself to the hospital. Summary After the procedure, it is common to have a sore throat, hoarseness, nausea, vomiting, or to feel weak, sleepy, or fatigue. For the time period you were told by your health care provider, do not drive or use machinery. Get help right away if you have difficulty breathing, have chest pain, or vomit blood. These symptoms may be an emergency. This information is not intended  to replace advice given to you by your health care provider. Make sure you discuss any questions you have with your health care provider. Document Revised: 10/02/2021 Document Reviewed: 10/02/2021 Elsevier Patient Education  2024 [employer].How to Use Chlorhexidine Prepackaged Cloths at Home Chlorhexidine gluconate (CHG) is a germ-killing (antiseptic) wash that is used to clean the skin. It can get rid of the germs that normally live on the skin and can keep them away for about 24 hours. One way to clean your skin with CHG is by using prepackaged CHG cloths. The following information gives steps on how to use CHG cloths to clean your skin. Supplies needed: CHG cloths. How to clean your whole body with CHG cloths Follow the order of these steps unless your health care provider gives you other  instructions: Use a new CHG cloth for each part of your body and after each step. Wash between any folds of skin, under your breasts, and between your fingers and toes. Front of neck, shoulders, and chest. Do not get CHG in your eyes, mouth, and ears. If CHG gets into your eyes or ears, rinse them well with water. Both arms, armpits, and hands. Stomach and groin. Right leg and foot. Left leg and foot. Back of neck and back. Do not rinse. Let your skin air dry. Do not put anything on your body afterward, such as powder or perfume. Use lotion only as told by your provider. How to clean your surgery area with CHG cloths Follow these steps when using CHG cloths to clean before surgery unless your provider gives you other instructions: Using the CHG cloths, vigorously wash the part of your body where the surgery will be done. Use a back-and-forth motion for 2 minutes. The skin should be completely wet with CHG when you are done. Do not put anything on your body afterward, such as powder, lotion, or perfume. Put on clean clothes or pajamas. If it is the night before surgery, sleep in clean sheets. General tips Only use CHG cloths as told, and follow the instructions on the label. Use the CHG cloths on clean, dry skin. Do not smoke and stay away from flames after using CHG. Your skin may feel sticky after using the CHG. This is normal. The sticky feeling will go away as the CHG dries. Do not use CHG: If you have a chlorhexidine allergy or have reacted to chlorhexidine in the past. On babies younger than 51 months of age. On your head, face, or genitals unless your provider tells you to. Contact a health care provider if: You have questions about using CHG. Your skin gets irritated or itchy. You have a rash after using CHG. You swallow any CHG. Call your local poison control center (([phone] in the [place] Your eyes itch badly, or they become very red or swollen. Your hearing  changes. You have trouble seeing. Get help right away if: You have swelling or tingling in your mouth or throat. You make high-pitched whistling sounds when you breathe, most often when you breathe out (wheeze). You have trouble breathing. These symptoms may be an emergency. Call 911right away. Do not wait to see if the symptoms will go away. Do not drive yourself to the hospital. This information is not intended to replace advice given to you by your health care provider. Make sure you discuss any questions you have with your health care provider. Document Revised: 01/18/2023 Document Reviewed: 01/14/2022 Elsevier Patient Education  2024 [employer].How to Use an  Incentive Spirometer An incentive spirometer is a tool that measures how well you are filling your lungs with each breath. Learning to take long, deep breaths using this tool can help you keep your lungs clear and active. This may help to reverse or lessen your chance of developing breathing (pulmonary) problems, especially infection. You may be asked to use a spirometer: After a surgery. If you have a lung problem or a history of smoking. After a long period of time when you have been unable to move or be active. If the spirometer includes an indicator to show the highest number that you have reached, your health care provider or respiratory therapist will help you set a goal. Keep a log of your progress as told by your health care provider. What are the risks? Breathing too quickly may cause dizziness or cause you to pass out. Take your time so you do not get dizzy or light-headed. If you are in pain, you may need to take pain medicine before doing incentive spirometry. It is harder to take a deep breath if you are having pain. How to use your incentive spirometer  Sit up on the edge of your bed or on a chair. Hold the incentive spirometer so that it is in an upright position. Before you use the spirometer, breathe out  normally. Place the mouthpiece in your mouth. Make sure your lips are closed tightly around it. Breathe in slowly and as deeply as you can through your mouth, causing the piston or the ball to rise toward the top of the chamber. Hold your breath for 3-5 seconds, or for as long as possible. If the spirometer includes a coach indicator, use this to guide you in breathing. Slow down your breathing if the indicator goes above the marked areas. Remove the mouthpiece from your mouth and breathe out normally. The piston or ball will return to the bottom of the chamber. Rest for a few seconds, then repeat the steps 10 or more times. Take your time and take a few normal breaths between deep breaths so that you do not get dizzy or light-headed. Do this every 1-2 hours when you are awake. If the spirometer includes a goal marker to show the highest number you have reached (best effort), use this as a goal to work toward during each repetition. After each set of 10 deep breaths, cough a few times. This will help to make sure that your lungs are clear. If you have an incision on your chest or abdomen from surgery, place a pillow or a rolled-up towel firmly against the incision when you cough. This can help to reduce pain while taking deep breaths and coughing. General tips When you are able to get out of bed: Walk around often. Continue to take deep breaths and cough in order to clear your lungs. Keep using the incentive spirometer until your health care provider says it is okay to stop using it. If you have been in the hospital, you may be told to keep using the spirometer at home. Contact a health care provider if: You are having difficulty using the spirometer. You have trouble using the spirometer as often as instructed. Your pain medicine is not giving enough relief for you to use the spirometer as told. You have a fever. Get help right away if: You develop shortness of breath. You develop a cough  with bloody mucus from the lungs. You have fluid or blood coming from an incision site after you  cough. Summary An incentive spirometer is a tool that can help you learn to take long, deep breaths to keep your lungs clear and active. You may be asked to use a spirometer after a surgery, if you have a lung problem or a history of smoking, or if you have been inactive for a long period of time. Use your incentive spirometer as instructed every 1-2 hours while you are awake. If you have an incision on your chest or abdomen, place a pillow or a rolled-up towel firmly against your incision when you cough. This will help to reduce pain. Get help right away if you have shortness of breath, you cough up bloody mucus, or blood comes from your incision when you cough. This information is not intended to replace advice given to you by your health care provider. Make sure you discuss any questions you have with your health care provider. Document Revised: 05/13/2023 Document Reviewed: 05/13/2023 Elsevier Patient Education  2024 [employer].

## 2023-12-13 ENCOUNTER — Encounter (HOSPITAL_COMMUNITY)
Admission: RE | Admit: 2023-12-13 | Discharge: 2023-12-13 | Disposition: A | Discharge status: Home / Self Care | Source: Ambulatory Visit | Attending: Orthopedic Surgery | Admitting: Orthopedic Surgery

## 2023-12-13 ENCOUNTER — Encounter (HOSPITAL_COMMUNITY): Payer: Self-pay

## 2023-12-13 VITALS — BP 156/81 | HR 81 | Temp 98.0°F | Resp 18 | Ht 70.0 in | Wt 181.0 lb

## 2023-12-13 DIAGNOSIS — I10 Essential (primary) hypertension: Secondary | ICD-10-CM | POA: Insufficient documentation

## 2023-12-13 DIAGNOSIS — E119 Type 2 diabetes mellitus without complications: Secondary | ICD-10-CM | POA: Diagnosis not present

## 2023-12-13 DIAGNOSIS — Z01818 Encounter for other preprocedural examination: Secondary | ICD-10-CM | POA: Diagnosis not present

## 2023-12-13 DIAGNOSIS — S82042A Displaced comminuted fracture of left patella, initial encounter for closed fracture: Secondary | ICD-10-CM

## 2023-12-13 HISTORY — DX: Other specified postprocedural states: Z98.890

## 2023-12-13 HISTORY — DX: Personal history of urinary calculi: Z87.442

## 2023-12-13 LAB — CBC
HCT: 47.9 % (ref 39.0–52.0)
Hemoglobin: 15.7 g/dL (ref 13.0–17.0)
MCH: 30 pg (ref 26.0–34.0)
MCHC: 32.8 g/dL (ref 30.0–36.0)
MCV: 91.4 fL (ref 80.0–100.0)
Platelets: 217 10*3/uL (ref 150–400)
RBC: 5.24 MIL/uL (ref 4.22–5.81)
RDW: 12.9 % (ref 11.5–15.5)
WBC: 7.7 10*3/uL (ref 4.0–10.5)
nRBC: 0 % (ref 0.0–0.2)

## 2023-12-13 LAB — BASIC METABOLIC PANEL WITH GFR
Anion gap: 10 (ref 5–15)
BUN: 24 mg/dL — ABNORMAL HIGH (ref 8–23)
CO2: 25 mmol/L (ref 22–32)
Calcium: 9.5 mg/dL (ref 8.9–10.3)
Chloride: 101 mmol/L (ref 98–111)
Creatinine, Ser: 0.68 mg/dL (ref 0.61–1.24)
GFR, Estimated: >60 mL/min (ref >60)
Glucose, Bld: 169 mg/dL — ABNORMAL HIGH (ref 70–99)
Potassium: 4 mmol/L (ref 3.5–5.1)
Sodium: 136 mmol/L (ref 135–145)

## 2023-12-13 LAB — HEMOGLOBIN A1C
Hgb A1c MFr Bld: 6.1 % — ABNORMAL HIGH (ref 4.8–5.6)
Mean Plasma Glucose: 128.37 mg/dL

## 2023-12-13 NOTE — Progress Notes (Signed)
   12/13/23 1022  OBSTRUCTIVE SLEEP APNEA  Have you ever been diagnosed with sleep apnea through a sleep study? No  Do you snore loudly (loud enough to be heard through closed doors)?  1  Do you often feel tired, fatigued, or sleepy during the daytime (such as falling asleep during driving or talking to someone)? 0  Has anyone observed you stop breathing during your sleep? 1  Do you have, or are you being treated for high blood pressure? 1  BMI more than 35 kg/m2? 0  Age > 50 (1-yes) 1  Neck circumference greater than:Male 16 inches or larger, Male 17inches or larger? 0  Male Gender (Yes=1) 1  Obstructive Sleep Apnea Score 5  Score 5 or greater  Results sent to PCP

## 2023-12-15 ENCOUNTER — Ambulatory Visit (HOSPITAL_COMMUNITY)
Admission: RE | Admit: 2023-12-15 | Discharge: 2023-12-15 | Disposition: A | Discharge status: Home / Self Care | Attending: Orthopedic Surgery | Admitting: Orthopedic Surgery

## 2023-12-15 ENCOUNTER — Ambulatory Visit (HOSPITAL_COMMUNITY): Admitting: Certified Registered"

## 2023-12-15 ENCOUNTER — Other Ambulatory Visit: Payer: Self-pay

## 2023-12-15 ENCOUNTER — Ambulatory Visit (HOSPITAL_COMMUNITY)

## 2023-12-15 ENCOUNTER — Encounter (HOSPITAL_COMMUNITY): Payer: Self-pay | Admitting: Orthopedic Surgery

## 2023-12-15 ENCOUNTER — Ambulatory Visit (HOSPITAL_BASED_OUTPATIENT_CLINIC_OR_DEPARTMENT_OTHER): Admitting: Certified Registered"

## 2023-12-15 ENCOUNTER — Encounter (HOSPITAL_COMMUNITY)
Admission: RE | Disposition: A | Discharge status: Home / Self Care | Payer: Self-pay | Source: Home / Self Care | Attending: Orthopedic Surgery

## 2023-12-15 DIAGNOSIS — I10 Essential (primary) hypertension: Secondary | ICD-10-CM | POA: Insufficient documentation

## 2023-12-15 DIAGNOSIS — Z7984 Long term (current) use of oral hypoglycemic drugs: Secondary | ICD-10-CM | POA: Diagnosis not present

## 2023-12-15 DIAGNOSIS — E119 Type 2 diabetes mellitus without complications: Secondary | ICD-10-CM | POA: Diagnosis not present

## 2023-12-15 DIAGNOSIS — S82002A Unspecified fracture of left patella, initial encounter for closed fracture: Secondary | ICD-10-CM | POA: Diagnosis not present

## 2023-12-15 DIAGNOSIS — W19XXXA Unspecified fall, initial encounter: Secondary | ICD-10-CM | POA: Diagnosis not present

## 2023-12-15 DIAGNOSIS — S82042A Displaced comminuted fracture of left patella, initial encounter for closed fracture: Secondary | ICD-10-CM | POA: Diagnosis not present

## 2023-12-15 HISTORY — PX: ORIF PATELLA: SHX5033

## 2023-12-15 LAB — GLUCOSE, CAPILLARY
Glucose-Capillary: 106 mg/dL — ABNORMAL HIGH (ref 70–99)
Glucose-Capillary: 189 mg/dL — ABNORMAL HIGH (ref 70–99)

## 2023-12-15 SURGERY — OPEN REDUCTION INTERNAL FIXATION (ORIF) PATELLA
Anesthesia: General | Site: Knee | Laterality: Left

## 2023-12-15 MED ORDER — CEFAZOLIN SODIUM-DEXTROSE 2-4 GM/100ML-% IV SOLN
2.0000 g | INTRAVENOUS | Status: AC
  Administered 2023-12-15: 2 g via INTRAVENOUS

## 2023-12-15 MED ORDER — FENTANYL CITRATE (PF) 100 MCG/2ML IJ SOLN
INTRAMUSCULAR | Status: AC
  Filled 2023-12-15: qty 2

## 2023-12-15 MED ORDER — CEFAZOLIN SODIUM-DEXTROSE 2-4 GM/100ML-% IV SOLN
INTRAVENOUS | Status: AC
  Filled 2023-12-15: qty 100

## 2023-12-15 MED ORDER — IBUPROFEN 600 MG PO TABS: 600.0000 mg | ORAL_TABLET | Freq: Two times a day (BID) | ORAL | 0 refills | Status: AC

## 2023-12-15 MED ORDER — PHENYLEPHRINE HCL-NACL 20-0.9 MG/250ML-% IV SOLN
INTRAVENOUS | Status: AC
  Filled 2023-12-15: qty 250

## 2023-12-15 MED ORDER — SEVOFLURANE IN SOLN
RESPIRATORY_TRACT | Status: AC
  Filled 2023-12-15: qty 250

## 2023-12-15 MED ORDER — FENTANYL CITRATE (PF) 100 MCG/2ML IJ SOLN
INTRAMUSCULAR | Status: DC | PRN
  Administered 2023-12-15 (×4): 50 ug via INTRAVENOUS

## 2023-12-15 MED ORDER — ONDANSETRON HCL 4 MG/2ML IJ SOLN
INTRAMUSCULAR | Status: DC | PRN
  Administered 2023-12-15: 4 mg via INTRAVENOUS

## 2023-12-15 MED ORDER — FENTANYL CITRATE PF 50 MCG/ML IJ SOSY: 25.0000 ug | PREFILLED_SYRINGE | INTRAMUSCULAR | Status: DC | PRN

## 2023-12-15 MED ORDER — ACETAMINOPHEN 500 MG PO TABS: 1000.0000 mg | ORAL_TABLET | Freq: Three times a day (TID) | ORAL | 0 refills | Status: AC

## 2023-12-15 MED ORDER — ONDANSETRON HCL 4 MG/2ML IJ SOLN: 4.0000 mg | Freq: Once | INTRAMUSCULAR | Status: DC | PRN

## 2023-12-15 MED ORDER — BUPIVACAINE-EPINEPHRINE (PF) 0.5% -1:200000 IJ SOLN
INTRAMUSCULAR | Status: AC
  Filled 2023-12-15: qty 30

## 2023-12-15 MED ORDER — BUPIVACAINE HCL 0.5 % IJ SOLN
INTRAMUSCULAR | Status: DC | PRN
  Administered 2023-12-15: 30 mL

## 2023-12-15 MED ORDER — PROPOFOL 10 MG/ML IV BOLUS
INTRAVENOUS | Status: AC
  Filled 2023-12-15: qty 20

## 2023-12-15 MED ORDER — SUGAMMADEX SODIUM 200 MG/2ML IV SOLN
INTRAVENOUS | Status: DC | PRN
  Administered 2023-12-15: 200 mg via INTRAVENOUS

## 2023-12-15 MED ORDER — DEXAMETHASONE SODIUM PHOSPHATE 10 MG/ML IJ SOLN
INTRAMUSCULAR | Status: AC
  Filled 2023-12-15: qty 1

## 2023-12-15 MED ORDER — ONDANSETRON HCL 4 MG/2ML IJ SOLN
INTRAMUSCULAR | Status: AC
Start: 2023-12-15 — End: ?
  Filled 2023-12-15: qty 2

## 2023-12-15 MED ORDER — HYDROMORPHONE HCL 1 MG/ML IJ SOLN
INTRAMUSCULAR | Status: DC | PRN
  Administered 2023-12-15 (×2): .5 mg via INTRAVENOUS

## 2023-12-15 MED ORDER — DEXMEDETOMIDINE HCL IN NACL 80 MCG/20ML IV SOLN
INTRAVENOUS | Status: AC
Start: 2023-12-15 — End: ?
  Filled 2023-12-15: qty 20

## 2023-12-15 MED ORDER — LACTATED RINGERS IV SOLN: INTRAVENOUS | Status: DC | PRN

## 2023-12-15 MED ORDER — ACETAMINOPHEN 10 MG/ML IV SOLN
INTRAVENOUS | Status: AC
  Filled 2023-12-15: qty 100

## 2023-12-15 MED ORDER — LIDOCAINE 2% (20 MG/ML) 5 ML SYRINGE
INTRAMUSCULAR | Status: DC | PRN
  Administered 2023-12-15: 80 mg via INTRAVENOUS

## 2023-12-15 MED ORDER — OXYCODONE HCL 5 MG PO TABS: 5.0000 mg | ORAL_TABLET | Freq: Once | ORAL | Status: DC | PRN

## 2023-12-15 MED ORDER — ROCURONIUM BROMIDE 100 MG/10ML IV SOLN
INTRAVENOUS | Status: DC | PRN
  Administered 2023-12-15: 70 mg via INTRAVENOUS

## 2023-12-15 MED ORDER — PROPOFOL 10 MG/ML IV BOLUS
INTRAVENOUS | Status: DC | PRN
  Administered 2023-12-15: 170 mg via INTRAVENOUS
  Administered 2023-12-15: 30 mg via INTRAVENOUS
  Administered 2023-12-15 (×2): 20 mg via INTRAVENOUS
  Administered 2023-12-15: 30 mg via INTRAVENOUS
  Administered 2023-12-15: 20 mg via INTRAVENOUS

## 2023-12-15 MED ORDER — HYDROMORPHONE HCL 1 MG/ML IJ SOLN
INTRAMUSCULAR | Status: AC
Start: 2023-12-15 — End: ?
  Filled 2023-12-15: qty 0.5

## 2023-12-15 MED ORDER — ORAL CARE MOUTH RINSE: 15.0000 mL | Freq: Once | OROMUCOSAL | Status: DC

## 2023-12-15 MED ORDER — HYDROMORPHONE HCL 1 MG/ML IJ SOLN
INTRAMUSCULAR | Status: AC
  Filled 2023-12-15: qty 0.5

## 2023-12-15 MED ORDER — CHLORHEXIDINE GLUCONATE 0.12 % MT SOLN
15.0000 mL | Freq: Once | OROMUCOSAL | Status: AC
  Administered 2023-12-15: 15 mL via OROMUCOSAL

## 2023-12-15 MED ORDER — OXYCODONE HCL 5 MG/5ML PO SOLN: 5.0000 mg | Freq: Once | ORAL | Status: DC | PRN

## 2023-12-15 MED ORDER — MIDAZOLAM HCL 2 MG/2ML IJ SOLN
INTRAMUSCULAR | Status: AC
  Filled 2023-12-15: qty 2

## 2023-12-15 MED ORDER — ACETAMINOPHEN 10 MG/ML IV SOLN
INTRAVENOUS | Status: DC | PRN
Start: 2023-12-15 — End: 2023-12-15
  Administered 2023-12-15: 1000 mg via INTRAVENOUS

## 2023-12-15 MED ORDER — OXYCODONE HCL 5 MG PO TABS: 5.0000 mg | ORAL_TABLET | Freq: Four times a day (QID) | ORAL | 0 refills | Status: AC | PRN

## 2023-12-15 MED ORDER — DEXAMETHASONE SODIUM PHOSPHATE 10 MG/ML IJ SOLN
INTRAMUSCULAR | Status: DC | PRN
  Administered 2023-12-15: 5 mg via INTRAVENOUS

## 2023-12-15 MED ORDER — CHLORHEXIDINE GLUCONATE 0.12 % MT SOLN: 15.0000 mL | Freq: Once | OROMUCOSAL | Status: DC

## 2023-12-15 MED ORDER — DEXMEDETOMIDINE HCL IN NACL 80 MCG/20ML IV SOLN
INTRAVENOUS | Status: DC | PRN
  Administered 2023-12-15: 4 ug via INTRAVENOUS
  Administered 2023-12-15: 12 ug via INTRAVENOUS
  Administered 2023-12-15 (×2): 4 ug via INTRAVENOUS
  Administered 2023-12-15: 8 ug via INTRAVENOUS

## 2023-12-15 MED ORDER — MIDAZOLAM HCL 2 MG/2ML IJ SOLN
INTRAMUSCULAR | Status: DC | PRN
Start: 2023-12-15 — End: 2023-12-15
  Administered 2023-12-15: 1 mg via INTRAVENOUS

## 2023-12-15 MED ORDER — ONDANSETRON HCL 4 MG PO TABS: 4.0000 mg | ORAL_TABLET | Freq: Three times a day (TID) | ORAL | 0 refills | Status: AC | PRN

## 2023-12-15 MED ORDER — LACTATED RINGERS IV SOLN: INTRAVENOUS | Status: DC

## 2023-12-15 MED ORDER — ASPIRIN EC 81 MG PO TBEC: 81.0000 mg | DELAYED_RELEASE_TABLET | Freq: Two times a day (BID) | ORAL | 11 refills | Status: AC

## 2023-12-15 MED ORDER — SODIUM CHLORIDE 0.9 % IR SOLN
Status: DC | PRN
  Administered 2023-12-15: 1000 mL

## 2023-12-15 SURGICAL SUPPLY — 46 items
BANDAGE ESMARK 6X9 LF (GAUZE/BANDAGES/DRESSINGS) ×1 IMPLANT
BIT DRILL 2.6 CANN (BIT) IMPLANT
BLADE SURG SZ10 CARB STEEL (BLADE) ×1 IMPLANT
BNDG COHESIVE 4X5 TAN STRL (GAUZE/BANDAGES/DRESSINGS) ×1 IMPLANT
BNDG ELASTIC 4X5.8 VLCR NS LF (GAUZE/BANDAGES/DRESSINGS) IMPLANT
BNDG ELASTIC 6X5.8 VLCR NS LF (GAUZE/BANDAGES/DRESSINGS) ×2 IMPLANT
BRACE T-SCOPE KNEE POSTOP (MISCELLANEOUS) IMPLANT
CHLORAPREP W/TINT 26 (MISCELLANEOUS) ×1 IMPLANT
CLOTH BEACON ORANGE TIMEOUT ST (SAFETY) ×1 IMPLANT
COOLER ICEMAN CLASSIC (MISCELLANEOUS) ×1 IMPLANT
COVER LIGHT HANDLE STERIS (MISCELLANEOUS) ×2 IMPLANT
CUFF TRNQT CYL 34X4.125X (TOURNIQUET CUFF) IMPLANT
DRAPE C-ARM FOLDED MOBILE STRL (DRAPES) ×1 IMPLANT
DRAPE C-ARMOR (DRAPES) ×1 IMPLANT
ELECTRODE REM PT RTRN 9FT ADLT (ELECTROSURGICAL) ×1 IMPLANT
FIBERTAPE 2 W/STRL NDL 17 (SUTURE) IMPLANT
GAUZE SPONGE 4X4 12PLY STRL (GAUZE/BANDAGES/DRESSINGS) ×1 IMPLANT
GLOVE BIO SURGEON STRL SZ8 (GLOVE) ×2 IMPLANT
GLOVE BIOGEL PI IND STRL 7.0 (GLOVE) ×3 IMPLANT
GLOVE BIOGEL PI IND STRL 8 (GLOVE) ×1 IMPLANT
GOWN STRL REUS W/TWL LRG LVL3 (GOWN DISPOSABLE) ×2 IMPLANT
GOWN STRL REUS W/TWL XL LVL3 (GOWN DISPOSABLE) ×1 IMPLANT
GUIDEWIRE 1.35MM DUAL TROCAR (WIRE) IMPLANT
IMMOBILIZER KNEE 19 UNV (ORTHOPEDIC SUPPLIES) ×1 IMPLANT
KIT TURNOVER KIT A (KITS) ×1 IMPLANT
MANIFOLD NEPTUNE II (INSTRUMENTS) ×1 IMPLANT
NDL HYPO 21X1.5 SAFETY (NEEDLE) ×1 IMPLANT
NEEDLE HYPO 21X1.5 SAFETY (NEEDLE) ×1 IMPLANT
NS IRRIG 1000ML POUR BTL (IV SOLUTION) ×1 IMPLANT
PACK BASIC LIMB (CUSTOM PROCEDURE TRAY) ×1 IMPLANT
PAD ARMBOARD POSITIONER FOAM (MISCELLANEOUS) ×1 IMPLANT
PAD CAST 4YDX4 CTTN HI CHSV (CAST SUPPLIES) IMPLANT
PAD COLD SHLDR WRAP-ON (PAD) ×1 IMPLANT
PADDING CAST COTTON 6X4 STRL (CAST SUPPLIES) ×1 IMPLANT
POSITIONER HEAD 8X9X4 ADT (SOFTGOODS) ×1 IMPLANT
SCREW CANN BLUNT TIP LP 4X42 (Screw) IMPLANT
SCREW CANN BLUNT TIP LP 4X46 (Screw) IMPLANT
SET BASIN LINEN APH (SET/KITS/TRAYS/PACK) ×1 IMPLANT
SPONGE T-LAP 18X18 ~~LOC~~+RFID (SPONGE) ×1 IMPLANT
STRIP CLOSURE SKIN 1/2X4 (GAUZE/BANDAGES/DRESSINGS) ×1 IMPLANT
SUT MNCRL AB 4-0 PS2 18 (SUTURE) ×1 IMPLANT
SUT MON AB 2-0 CT1 36 (SUTURE) ×1 IMPLANT
SUT VIC AB 0 CT1 27XBRD ANTBC (SUTURE) ×1 IMPLANT
SUT VIC AB 1 CT1 27XBRD ANTBC (SUTURE) ×1 IMPLANT
SYR 30ML LL (SYRINGE) ×1 IMPLANT
SYR BULB IRRIG 60ML STRL (SYRINGE) ×1 IMPLANT

## 2023-12-15 NOTE — Anesthesia Procedure Notes (Signed)
 Procedure Name: Intubation Date/Time: 12/15/2023 7:46 AM  Performed by: Coretha Dew, MDPre-anesthesia Checklist: Patient identified, Emergency Drugs available, Suction available, Timeout performed and Patient being monitored Patient Re-evaluated:Patient Re-evaluated prior to induction Oxygen Delivery Method: Circle system utilized Preoxygenation: Pre-oxygenation with 100% oxygen Induction Type: IV induction Ventilation: Oral airway inserted - appropriate to patient size Laryngoscope Size: Annabell Key and 2 Grade View: Grade I Tube type: Oral Tube size: 7.0 mm Number of attempts: 1 Placement Confirmation: ETT inserted through vocal cords under direct vision, positive ETCO2 and breath sounds checked- equal and bilateral Secured at: 22 cm Tube secured with: Tape Dental Injury: Teeth and Oropharynx as per pre-operative assessment

## 2023-12-15 NOTE — Op Note (Signed)
 Orthopaedic Surgery Operative Note (CSN: [id number])  [name]  Dec 11, 1950 Date of Surgery: 12/15/2023   Diagnoses:  Comminuted left patella fracture  Procedure: ORIF of left patella fracture    Operative Finding Successful completion of the planned procedure.  Reduction of the comminuted left patella fracture, and fixation with 2 cannulated screws.  Suture tape was passed in a figure-of-eight configuration, and secured to completed tension band construct.   Post-Op Diagnosis: Same Surgeons:Primary: Tonita Frater, MD Assistants: Isaac Manus Location: [facility] OR ROOM 3 Anesthesia: General with local anesthesia Antibiotics: Ancef  2 g Tourniquet time:  Total Tourniquet Time Documented: Thigh (Left) - 71 minutes Total: Thigh (Left) - 71 minutes  Estimated Blood Loss: 50 cc Complications: None Specimens: None  Implants: Implant Name Type Inv. Item Serial No. Manufacturer Lot No. LRB No. Used Action  SCREW CANN BLUNT TIP LP [id number] - [id number] Screw SCREW CANN BLUNT TIP LP [id number]  [employer] STERILE ON SET FROM SPD Left 2 Implanted    Indications for Surgery:   [name] is a 73 y.o. male who directly under his left knee, sustaining a comminuted left patella fracture.  He is healthy and active.  I recommended operative fixation, in order to restore form and function.  Benefits and risks of operative and nonoperative management were discussed prior to surgery with the patient and informed consent form was completed.  Specific risks including infection, need for additional surgery, stiffness, persistent pain, bleeding, nonunion, malunion, blood clots and more severe complications associated with anesthesia.  He elected proceed.  Surgical consent was finalized.   Procedure:   The patient was identified properly. Informed consent was obtained and the surgical site was marked. The patient was taken to the OR where general anesthesia was induced.  The patient was positioned supine,  with his leg on bone foam.  The left leg was prepped and draped in the usual sterile fashion.  Timeout was performed before the beginning of the case.  Tourniquet was used for the above duration.  We made a long contusional incision centered over the patella, extending proximally and distally to expose the tendons.  We incised sharply through skin, then continued our dissection to the anterior surface of the patella.  Bleeders were cauterized.  The patella fracture was visible.  This was opened, and irrigated to clear some fracture hematoma.  We used a curette to remove additional fracture hematoma.  We then used a point-to-point clamp to help reduce the fracture, and provide compression across the fracture.  This was confirmed under fluoroscopy.  We achieved an excellent reduction despite the comminution.  There was no step-off visible under fluoroscopy.  Next, we introduced 2 K wires in antegrade fashion, in both the medial and lateral aspect of the patella.  This was roughly equal distance from the center of the patella.  These were roughly parallel.  Screws were measured.  We then drilled over the K wires across the proximal portion of the fracture.  Cannulated screws were then introduced within the patella, to achieve compression across the fracture site.  Positioning of the screws was confirmed under fluoroscopy.  We then introduced a suture tape through the cannulated screws in a figure-of-eight fashion.  The suture tape was then secured, to create a tension band construct.  This achieved excellent compression across the fracture.  Placement of the screws and the reduction were confirmed under fluoroscopy.  We then ranged the left knee to approximately 90 degrees, and there was no  obvious gapping at the fracture site.  At this point, we were satisfied with the repair.  We irrigated the wound copiously.  We closed the incision in a multilayer fashion with absorbable suture.  Sterile dressing was  placed.  We then placed an ice machine, as well as a hinged knee brace, locked in extension.  Patient was awoken taken to PACU in stable condition.   Post-operative plan:  The patient will be NWB on the operative extremity Knee brace locked in extension, to be worn at all times, with the exception of hygiene. Discharge home from the PACU once they have recovered DVT prophylaxis Aspirin 81 mg twice daily for 6 weeks.    Pain control with PRN pain medication preferring oral medicines.   Follow up plan will be scheduled in approximately 10-14 days for incision check and XR.

## 2023-12-15 NOTE — Discharge Instructions (Signed)
 Melvine Julin A. Ernesta Heading, MD MS [facility] [address] [place],  [place]  [address] Phone: [phone] Fax: [phone]   POST-OPERATIVE INSTRUCTIONS - LOWER EXTREMITY   WOUND CARE Please keep dressing clean, dry and intact until followup.  Keep your leg straight at all times until your follow up appointment.  You can remove the outer dressing on Post-op Day #3 You may shower on Post-Op Day #3.  You must keep the dressing dry during this process and may find that a plastic bag taped around the leg or alternatively a towel based bath may be a better option.   If you get your dressing wet, please contact our clinic.  EXERCISES Due to your injury, you will not be able to bear weight through your extremity.   DO NOT PUT ANY WEIGHT ON YOUR OPERATIVE LEG Please use crutches or a walker to avoid weight bearing.   REGIONAL ANESTHESIA (NERVE BLOCKS) The anesthesia team may have performed a nerve block for you if safe in the setting of your care.  This is a great tool used to minimize pain.  Typically the block may start wearing off overnight but the long acting medicine may last for 3-4 days.  The nerve block wearing off can be a challenging period but please utilize your as needed pain medications to try and manage this period.    POST-OP MEDICATIONS- Multimodal approach to pain control  In general your pain will be controlled with a combination of substances.  Prescriptions unless otherwise discussed are electronically sent to your pharmacy.  This is a carefully made plan we use to minimize narcotic use.     - Ibuprofen - Anti-inflammatory medication taken on a scheduled basis  - Acetaminophen - Non-narcotic pain medicine taken on a scheduled basis   - Oxycodone - This is a strong narcotic, to be used only on an "as needed" basis for pain.  -  Aspirin 81mg  - This medicine is used to minimize the risk of blood clots after surgery.             -          Zofran  - take  as needed for nausea   FOLLOW-UP If you develop a Fever (>101.5), Redness or Drainage from the surgical incision site, please call our office to arrange for an evaluation. Please call the office to schedule a follow-up appointment for your incision check if you do not already have one, 10-14 days post-operatively.  IF YOU HAVE ANY QUESTIONS, PLEASE FEEL FREE TO CALL OUR OFFICE.  HELPFUL INFORMATION  If you had a block, it will wear off between 8-24 hrs postop typically.  This is period when your pain may go from nearly zero to the pain you would have had postop without the block.  This is an abrupt transition but nothing dangerous is happening.  You may take an extra dose of narcotic when this happens.  You should wean off your narcotic medicines as soon as you are able.  Most patients will be off or using minimal narcotics before their first postop appointment.   Elevating your leg will help with swelling and pain control.  You are encouraged to elevate your leg as much as possible in the first couple of weeks following surgery.  Imagine a drop of water on your toe, and your goal is to get that water back to your heart.  We suggest you use the pain medication the first night prior to  going to bed, in order to ease any pain when the anesthesia wears off. You should avoid taking pain medications on an empty stomach as it will make you nauseous.  Do not drink alcoholic beverages or take illicit drugs when taking pain medications.  In most states it is against the law to drive while you are in a splint or sling.  And certainly against the law to drive while taking narcotics.  You may return to work/school in the next couple of days when you feel up to it.   Pain medication may make you constipated.  Below are a few solutions to try in this order: Decrease the amount of pain medication if you aren't having pain. Drink lots of decaffeinated fluids. Drink prune juice and/or each dried  prunes  If the first 3 don't work start with additional solutions Take Colace - an over-the-counter stool softener Take Senokot - an over-the-counter laxative Take Miralax - a stronger over-the-counter laxative

## 2023-12-15 NOTE — Anesthesia Preprocedure Evaluation (Signed)
 Anesthesia Evaluation  Patient identified by MRN, date of birth, ID band Patient awake    Reviewed: Allergy & Precautions, H&P , NPO status , Patient's Chart, lab work & pertinent test results, reviewed documented beta blocker date and time   History of Anesthesia Complications (+) PONV and history of anesthetic complications  Airway Mallampati: II  TM Distance: >3 FB Neck ROM: full    Dental no notable dental hx.    Pulmonary neg pulmonary ROS   Pulmonary exam normal breath sounds clear to auscultation       Cardiovascular Exercise Tolerance: Good hypertension, negative cardio ROS  Rhythm:regular Rate:Normal     Neuro/Psych negative neurological ROS  negative psych ROS   GI/Hepatic negative GI ROS, Neg liver ROS,,,  Endo/Other  negative endocrine ROSdiabetes    Renal/GU negative Renal ROS  negative genitourinary   Musculoskeletal   Abdominal   Peds  Hematology negative hematology ROS (+)   Anesthesia Other Findings   Reproductive/Obstetrics negative OB ROS                             Anesthesia Physical Anesthesia Plan  ASA: 2  Anesthesia Plan: General and Spinal   Post-op Pain Management:    Induction:   PONV Risk Score and Plan: Propofol  infusion  Airway Management Planned:   Additional Equipment:   Intra-op Plan:   Post-operative Plan:   Informed Consent: I have reviewed the patients History and Physical, chart, labs and discussed the procedure including the risks, benefits and alternatives for the proposed anesthesia with the patient or authorized representative who has indicated his/her understanding and acceptance.     Dental Advisory Given  Plan Discussed with: CRNA  Anesthesia Plan Comments:        Anesthesia Quick Evaluation

## 2023-12-15 NOTE — Interval H&P Note (Signed)
 History and Physical Interval Note:  12/15/2023 7:18 AM  [name]  has presented today for surgery, with the diagnosis of Comminuted left patella fracture.  The various methods of treatment have been discussed with the patient and family. After consideration of risks, benefits and other options for treatment, the patient has consented to  Procedure(s): OPEN REDUCTION INTERNAL FIXATION (ORIF) PATELLA (Left) as a surgical intervention.  The patient's history has been reviewed, patient examined, no change in status, stable for surgery.  I have reviewed the patient's chart and labs.  Questions were answered to the patient's satisfaction.     Tonita Frater

## 2023-12-15 NOTE — Transfer of Care (Signed)
 Immediate Anesthesia Transfer of Care Note  Patient: [name]  Procedure(s) Performed: OPEN REDUCTION INTERNAL FIXATION (ORIF) PATELLA (Left: Knee)  Patient Location: PACU  Anesthesia Type:General  Level of Consciousness: awake, alert , oriented, patient cooperative, and responds to stimulation  Airway & Oxygen Therapy: Patient connected to face mask oxygen  Post-op Assessment: Report given to RN, Post -op Vital signs reviewed and stable, Patient moving all extremities, and Patient moving all extremities X 4  Post vital signs: Reviewed and stable  Last Vitals:  Vitals Value Taken Time  BP 166/74 12/15/23 0931  Temp 36.6 C 12/15/23 0931  Pulse 81 12/15/23 0944  Resp 21 12/15/23 0944  SpO2 99 % 12/15/23 0944  Vitals shown include unfiled device data.  Last Pain:  Vitals:   12/15/23 0931  PainSc: 0-No pain      Patients Stated Pain Goal: 1 (12/15/23 4132)  Complications: There were no known notable events for this encounter.

## 2023-12-16 ENCOUNTER — Encounter (HOSPITAL_COMMUNITY): Payer: Self-pay | Admitting: Orthopedic Surgery

## 2023-12-18 ENCOUNTER — Encounter (HOSPITAL_COMMUNITY): Payer: Self-pay | Admitting: Orthopedic Surgery

## 2023-12-18 NOTE — Anesthesia Postprocedure Evaluation (Signed)
 Anesthesia Post Note  Patient: [name]  Procedure(s) Performed: OPEN REDUCTION INTERNAL FIXATION (ORIF) PATELLA (Left: Knee)  Patient location during evaluation: Phase II Anesthesia Type: Spinal Level of consciousness: awake Pain management: pain level controlled Vital Signs Assessment: post-procedure vital signs reviewed and stable Respiratory status: spontaneous breathing and respiratory function stable Cardiovascular status: blood pressure returned to baseline and stable Postop Assessment: no headache and no apparent nausea or vomiting Anesthetic complications: no Comments: Late entry   There were no known notable events for this encounter.   Last Vitals:  Vitals:   12/15/23 1003 12/15/23 1017  BP: (!) 161/71 (!) 164/89  Pulse:  78  Resp:  11  Temp: 36.8 C 36.7 C  SpO2:  96%    Last Pain:  Vitals:   12/16/23 1538  TempSrc:   PainSc: 1                  Coretha Dew

## 2023-12-23 DIAGNOSIS — K4090 Unilateral inguinal hernia, without obstruction or gangrene, not specified as recurrent: Secondary | ICD-10-CM | POA: Diagnosis not present

## 2023-12-27 ENCOUNTER — Other Ambulatory Visit (INDEPENDENT_AMBULATORY_CARE_PROVIDER_SITE_OTHER): Payer: Self-pay

## 2023-12-27 ENCOUNTER — Ambulatory Visit (INDEPENDENT_AMBULATORY_CARE_PROVIDER_SITE_OTHER): Admitting: Orthopedic Surgery

## 2023-12-27 ENCOUNTER — Encounter: Payer: Self-pay | Admitting: Orthopedic Surgery

## 2023-12-27 DIAGNOSIS — S82042D Displaced comminuted fracture of left patella, subsequent encounter for closed fracture with routine healing: Secondary | ICD-10-CM

## 2023-12-27 DIAGNOSIS — S82042A Displaced comminuted fracture of left patella, initial encounter for closed fracture: Secondary | ICD-10-CM

## 2023-12-27 NOTE — Patient Instructions (Signed)
 No weightbearing.  Keep the brace locked in extension.  Okay to unlock or gentle range of motion.  This was demonstrated for you in clinic today.  No active range of motion.  Initiate passive range of motion only.  Get assistance to extend the leg.  Okay to shower.  Do not submerge the incision.  Do not Scrub the incision.  Pat the incision dry.

## 2023-12-28 NOTE — Progress Notes (Signed)
 Orthopaedic Postop Note  Assessment: [name] is a 73 y.o. male s/p ORIF left patella fracture  DOS: 12/15/2023  Plan: Sutures trimmed, Steri-Strips placed Radiographs reviewed Continue nonweightbearing on the left lower extremity Can unlock the brace to 45 degrees and initiate passive range of motion Brace to remain locked in extension at all other times Medications as needed Continue aspirin  Follow-up in 2 weeks  Follow-up: Return in about 2 weeks (around 01/10/2024). XR at next visit: AP and lateral left knee  Subjective:  Chief Complaint  Patient presents with   Routine Post Op    L patella ORIF DOS 12/15/23    History of Present Illness: [name] is a 73 y.o. male who presents following the above stated procedure.  He is doing very well following surgery.  Surgery was approximately 2 weeks ago.  Pain is controlled.  No issues with surgical incision.  He is left that dressing in place.  No numbness or tingling.  He has remained nonweightbearing.  Brace has been locked in extension.  Review of Systems: No fevers or chills No numbness or tingling No Chest Pain No shortness of breath   Objective: There were no vitals taken for this visit.  Physical Exam:  Alert and oriented.  No acute distress  Ambulating with the assistance of a walker  Surgical incision is healing appropriately.  No surrounding erythema or drainage.  He tolerates gentle range of motion to approximately 30 degrees of the left knee.  Some diffuse tenderness and residual swelling.  Toes warm and well-perfused.  Sensation intact to the dorsum of the foot.  IMAGING: I personally ordered and reviewed the following images:  AP and lateral x-rays of the left knee were obtained in clinic today.  These are compared to prior x-rays.  Comminuted fracture of the patella remains in unchanged alignment.  There has been no interval displacement.  Screws remain in place.  No change in overall  alignment or position of the screws.  No bony lesions.  Impression: Stable left patella following ORIF without hardware failure  Tonita Frater, MD 12/28/2023 8:22 AM

## 2024-01-10 ENCOUNTER — Other Ambulatory Visit (INDEPENDENT_AMBULATORY_CARE_PROVIDER_SITE_OTHER): Payer: Self-pay

## 2024-01-10 ENCOUNTER — Ambulatory Visit (INDEPENDENT_AMBULATORY_CARE_PROVIDER_SITE_OTHER): Admitting: Orthopedic Surgery

## 2024-01-10 ENCOUNTER — Encounter: Payer: Self-pay | Admitting: Orthopedic Surgery

## 2024-01-10 DIAGNOSIS — S82042D Displaced comminuted fracture of left patella, subsequent encounter for closed fracture with routine healing: Secondary | ICD-10-CM

## 2024-01-10 NOTE — Progress Notes (Signed)
 Orthopaedic Postop Note  Assessment: [name] is a 73 y.o. male s/p ORIF left patella fracture  DOS: 12/15/2023  Plan: [name] is doing well overall.  Pain is controlled.  Radiographs remain stable.  He is lacking some extension.  Brace was adjusted, to try and keep the leg straight, and achieve the last few degrees of extension.  Brace unlocked to 90 degrees, to work on passive range of motion several times per day.  81 mg of aspirin  twice daily for another 2 weeks.  Follow-up in 3 weeks.  Follow-up: Return in about 3 weeks (around 01/31/2024). XR at next visit: AP and lateral left knee  Subjective:  Chief Complaint  Patient presents with   Routine Post Op    L patella ORIF DOS 12/15/23    History of Present Illness: [name] is a 73 y.o. male who presents following the above stated procedure.  He is doing very well following surgery.  Surgery was approximately 1 month ago.  Pain is controlled.  He denies fevers or chills.  He is scheduled for root canal later this week.  He is taking penicillin in preparation for that.  He is scheduled for hernia surgery at the end of next month.  He has been working on his range of motion 3 times per day, completing 12 reps of passive motion.  Review of Systems: No fevers or chills No numbness or tingling No Chest Pain No shortness of breath   Objective: There were no vitals taken for this visit.  Physical Exam:  Alert and oriented.  No acute distress  Surgical incision is healing.  No surrounding erythema or drainage.  Passive range of motion from 10-90 degrees.  He sits comfortably at 90 degrees of flexion.  Toes warm and well-perfused.  He is lacking some extension.  Ambulating well with the assistance of a walker.  IMAGING: I personally ordered and reviewed the following images:  AP and lateral views of the left knee were obtained.  These are compared to prior x-rays.  Screws are not backing out.  Overall alignment  remains unchanged.  There has been some interval consolidation.  No acute injuries.  Impression: ORIF of the left patella without hardware failure  Troy DELENA Horde, MD 01/10/2024 10:42 AM

## 2024-01-10 NOTE — Patient Instructions (Signed)
 No weightbearing on the left leg.  Ambulate with a walker.  Please have the brace locked in extension when using the walker  Okay to unlock the brace to 90 degrees for passive, gentle motion as you have been  Aspirin  81 mg twice a day for another 2 weeks

## 2024-01-31 ENCOUNTER — Other Ambulatory Visit (INDEPENDENT_AMBULATORY_CARE_PROVIDER_SITE_OTHER): Payer: Self-pay

## 2024-01-31 ENCOUNTER — Encounter: Payer: Self-pay | Admitting: Orthopedic Surgery

## 2024-01-31 ENCOUNTER — Ambulatory Visit: Admitting: Orthopedic Surgery

## 2024-01-31 DIAGNOSIS — S82042D Displaced comminuted fracture of left patella, subsequent encounter for closed fracture with routine healing: Secondary | ICD-10-CM

## 2024-01-31 NOTE — Progress Notes (Signed)
 Orthopaedic Postop Note  Assessment: [name] is a 73 y.o. male s/p ORIF left patella fracture  DOS: 12/15/2023  Plan: [name] has done very well.  He has good range of motion.  We will initiate weightbearing.  I recommended a period of time with the brace locked in extension, weightbearing as tolerated.  Continue to use the walker.  In approximately 1-2 weeks, the brace can be unlocked at all times.  He will follow-up in 1 month.  Follow-up: Return in about 4 weeks (around 02/28/2024). XR at next visit: AP and lateral left knee  Subjective:  Chief Complaint  Patient presents with   Routine Post Op    L patella DOS 12/15/23    History of Present Illness: [name] is a 73 y.o. male who presents following the above stated procedure.  He is doing very well following surgery.  Surgery was approximately 6-7 weeks ago.  He is not having any pain at this time.  He has been working on range of motion.  He continues to use the brace at all times, including for sleep.  He is ready to start bearing weight.   Review of Systems: No fevers or chills No numbness or tingling No Chest Pain No shortness of breath   Objective: There were no vitals taken for this visit.  Physical Exam:  Alert and oriented.  No acute distress  Surgical incision has healed.  No surrounding erythema or drainage.  Active range of motion from 0-100 degrees.  Toes warm well-perfused.  No tenderness to palpation.  No swelling.   IMAGING: I personally ordered and reviewed the following images:  AP lateral views of the left knee were obtained in clinic today.  These are compared to available x-rays.  Screws within the patella remain in unchanged alignment.  There has been interval consolidation of the fracture.  No change in alignment.  Impression: Stable left patella fracture following ORIF  Troy DELENA Horde, MD 01/31/2024 10:10 AM

## 2024-02-09 DIAGNOSIS — K4031 Unilateral inguinal hernia, with obstruction, without gangrene, recurrent: Secondary | ICD-10-CM | POA: Diagnosis not present

## 2024-02-09 DIAGNOSIS — I10 Essential (primary) hypertension: Secondary | ICD-10-CM | POA: Diagnosis not present

## 2024-02-09 DIAGNOSIS — K660 Peritoneal adhesions (postprocedural) (postinfection): Secondary | ICD-10-CM | POA: Diagnosis not present

## 2024-02-09 DIAGNOSIS — E119 Type 2 diabetes mellitus without complications: Secondary | ICD-10-CM | POA: Diagnosis not present

## 2024-02-09 DIAGNOSIS — Z79899 Other long term (current) drug therapy: Secondary | ICD-10-CM | POA: Diagnosis not present

## 2024-02-17 DIAGNOSIS — E782 Mixed hyperlipidemia: Secondary | ICD-10-CM | POA: Diagnosis not present

## 2024-02-17 DIAGNOSIS — E1165 Type 2 diabetes mellitus with hyperglycemia: Secondary | ICD-10-CM | POA: Diagnosis not present

## 2024-02-24 DIAGNOSIS — I10 Essential (primary) hypertension: Secondary | ICD-10-CM | POA: Diagnosis not present

## 2024-02-24 DIAGNOSIS — K219 Gastro-esophageal reflux disease without esophagitis: Secondary | ICD-10-CM | POA: Diagnosis not present

## 2024-02-24 DIAGNOSIS — E782 Mixed hyperlipidemia: Secondary | ICD-10-CM | POA: Diagnosis not present

## 2024-02-24 DIAGNOSIS — E1165 Type 2 diabetes mellitus with hyperglycemia: Secondary | ICD-10-CM | POA: Diagnosis not present

## 2024-02-24 DIAGNOSIS — R978 Other abnormal tumor markers: Secondary | ICD-10-CM | POA: Diagnosis not present

## 2024-02-24 DIAGNOSIS — R42 Dizziness and giddiness: Secondary | ICD-10-CM | POA: Diagnosis not present

## 2024-02-24 DIAGNOSIS — R195 Other fecal abnormalities: Secondary | ICD-10-CM | POA: Diagnosis not present

## 2024-02-24 DIAGNOSIS — M25562 Pain in left knee: Secondary | ICD-10-CM | POA: Diagnosis not present

## 2024-02-28 ENCOUNTER — Encounter: Admitting: Orthopedic Surgery

## 2024-02-29 ENCOUNTER — Encounter: Admitting: Orthopedic Surgery

## 2024-03-02 ENCOUNTER — Ambulatory Visit (INDEPENDENT_AMBULATORY_CARE_PROVIDER_SITE_OTHER): Admitting: Orthopedic Surgery

## 2024-03-02 ENCOUNTER — Other Ambulatory Visit (INDEPENDENT_AMBULATORY_CARE_PROVIDER_SITE_OTHER): Payer: Self-pay

## 2024-03-02 ENCOUNTER — Encounter: Payer: Self-pay | Admitting: Orthopedic Surgery

## 2024-03-02 DIAGNOSIS — S82042A Displaced comminuted fracture of left patella, initial encounter for closed fracture: Secondary | ICD-10-CM

## 2024-03-02 NOTE — Patient Instructions (Signed)

## 2024-03-02 NOTE — Progress Notes (Signed)
 Orthopaedic Postop Note  Assessment: [name] is a 73 y.o. male s/p ORIF left patella fracture  DOS: 12/15/2023  Plan: Mr. Frazzini is moving well.  Radiographs stable.  He has good motion.  Atrophy of his quadriceps.  Encouraged walking.  Provided knee strengthening exercises.  Does not need the brace.  Follow up in 6 weeks.    Follow-up: Return in about 6 weeks (around 04/13/2024). XR at next visit: AP and lateral left knee  Subjective:  Chief Complaint  Patient presents with   Knee Injury    Left     History of Present Illness: [name] is a 73 y.o. male who presents following the above stated procedure.  He is doing very well following surgery.  Surgery was approximately 10 weeks ago.  He denies pain.  Just recently stopped using the brace at home.  No assistive device.    Review of Systems: No fevers or chills No numbness or tingling No Chest Pain No shortness of breath   Objective: There were no vitals taken for this visit.  Physical Exam:  Alert and oriented.  No acute distress  Surgical incision has healed.  No surrounding erythema or drainage.  Active range of motion from 0-110 degrees.  Toes warm well-perfused.  No tenderness to palpation.  No swelling.  Atrophy of the left quad.  He is able to maintain straight leg raise without pain.    IMAGING: I personally ordered and reviewed the following images:  AP lateral views of the left knee were obtained in clinic today.  These are compared to prior x-rays.  Unchanged alignment overall.  Interval consolidation.  No displacement.  No bony lesions.   Impression: Stable left patella fracture without hardware migration or failure.   Troy DELENA Horde, MD 03/02/2024 10:45 AM

## 2024-03-16 DIAGNOSIS — K4090 Unilateral inguinal hernia, without obstruction or gangrene, not specified as recurrent: Secondary | ICD-10-CM | POA: Diagnosis not present

## 2024-04-13 ENCOUNTER — Other Ambulatory Visit: Payer: Self-pay

## 2024-04-13 ENCOUNTER — Ambulatory Visit (INDEPENDENT_AMBULATORY_CARE_PROVIDER_SITE_OTHER): Admitting: Orthopedic Surgery

## 2024-04-13 ENCOUNTER — Encounter: Payer: Self-pay | Admitting: Orthopedic Surgery

## 2024-04-13 DIAGNOSIS — S82042D Displaced comminuted fracture of left patella, subsequent encounter for closed fracture with routine healing: Secondary | ICD-10-CM

## 2024-04-13 NOTE — Progress Notes (Signed)
 Orthopaedic Postop Note  Assessment: [name] is a 73 y.o. male s/p ORIF left patella fracture  DOS: 12/15/2023  Plan: [name] has done very well following surgery.  He has no pain.  He does note some weakness.  He continues to work on strengthening the left leg.  Radiographs were reviewed, and demonstrate excellent healing.  He is very pleased with his improvements.  He has any further issues, he will contact the clinic.  Otherwise, follow-up as needed.   Follow-up: Return if symptoms worsen or fail to improve. XR at next visit: AP and lateral left knee  Subjective:  Chief Complaint  Patient presents with   Knee Injury    Left patella fracture     History of Present Illness: [name] is a 73 y.o. male who presents following the above stated procedure.  Surgery was approximately 4 months ago.  He has done well.  He is back to his usual activities.  He does note some weakness, and is very careful with stairs and on incline.  No issues with his incision.  He is happy with his improvements.  He continues to improve.   Review of Systems: No fevers or chills No numbness or tingling No Chest Pain No shortness of breath   Objective: There were no vitals taken for this visit.  Physical Exam:  Alert and oriented.  No acute distress  Surgical incision is healed.  No surrounding erythema or drainage.  No swelling.  He has good range of motion of the left knee.  No tenderness to palpation over the patella.  Continues to have some atrophy of the quadriceps.  Sensation is intact distally.  A little bit of increased at sensitivity over the incision.   IMAGING: I personally ordered and reviewed the following images:  AP and lateral views of the left knee were obtained in clinic today.  These are compared to available x-rays.  Patella remains in excellent alignment.  Hardware is intact.  It is not backing out.  There are some osteophytes in the proximal pole.  Mild to  moderate degenerative changes overall.  No new injuries.  Impression: Healed left patella fracture without hardware failure.  Troy DELENA Horde, MD 04/13/2024 9:40 AM
